# Patient Record
Sex: Female | Born: 1965 | Race: Black or African American | Hispanic: No | Marital: Single
Health system: Southern US, Academic
[De-identification: ages and names within clinical notes are randomized; demographics above are authoritative.]

## PROBLEM LIST (undated history)

## (undated) ENCOUNTER — Ambulatory Visit

## (undated) ENCOUNTER — Encounter
Attending: Student in an Organized Health Care Education/Training Program | Primary: Student in an Organized Health Care Education/Training Program

## (undated) ENCOUNTER — Encounter

## (undated) ENCOUNTER — Encounter: Attending: Internal Medicine | Primary: Internal Medicine

## (undated) ENCOUNTER — Ambulatory Visit: Payer: PRIVATE HEALTH INSURANCE

## (undated) ENCOUNTER — Telehealth

## (undated) ENCOUNTER — Telehealth: Attending: Clinical | Primary: Clinical

## (undated) ENCOUNTER — Encounter
Attending: Pharmacist Clinician (PhC)/ Clinical Pharmacy Specialist | Primary: Pharmacist Clinician (PhC)/ Clinical Pharmacy Specialist

## (undated) ENCOUNTER — Inpatient Hospital Stay: Payer: PRIVATE HEALTH INSURANCE

## (undated) ENCOUNTER — Encounter: Attending: Mental Health | Primary: Mental Health

## (undated) ENCOUNTER — Encounter: Attending: Family | Primary: Family

## (undated) ENCOUNTER — Ambulatory Visit: Payer: MEDICAID

## (undated) ENCOUNTER — Ambulatory Visit
Payer: PRIVATE HEALTH INSURANCE | Attending: Student in an Organized Health Care Education/Training Program | Primary: Student in an Organized Health Care Education/Training Program

## (undated) ENCOUNTER — Telehealth: Attending: Internal Medicine | Primary: Internal Medicine

## (undated) ENCOUNTER — Ambulatory Visit: Attending: Internal Medicine | Primary: Internal Medicine

## (undated) ENCOUNTER — Ambulatory Visit: Payer: PRIVATE HEALTH INSURANCE | Attending: Registered" | Primary: Registered"

## (undated) ENCOUNTER — Ambulatory Visit: Attending: Dermatology | Primary: Dermatology

## (undated) ENCOUNTER — Telehealth: Attending: Family | Primary: Family

## (undated) ENCOUNTER — Ambulatory Visit: Payer: Medicaid (Managed Care) | Attending: Family | Primary: Family

## (undated) ENCOUNTER — Encounter: Attending: Obstetrics & Gynecology | Primary: Obstetrics & Gynecology

## (undated) ENCOUNTER — Ambulatory Visit: Payer: MEDICAID | Attending: Neurology | Primary: Neurology

## (undated) ENCOUNTER — Ambulatory Visit: Payer: PRIVATE HEALTH INSURANCE | Attending: "Endocrinology | Primary: "Endocrinology

## (undated) ENCOUNTER — Encounter: Attending: Physician Assistant | Primary: Physician Assistant

## (undated) ENCOUNTER — Ambulatory Visit: Payer: PRIVATE HEALTH INSURANCE | Attending: Family | Primary: Family

## (undated) ENCOUNTER — Telehealth: Attending: Registered" | Primary: Registered"

## (undated) ENCOUNTER — Ambulatory Visit: Payer: Medicaid (Managed Care)

## (undated) ENCOUNTER — Encounter: Attending: Registered" | Primary: Registered"

## (undated) ENCOUNTER — Ambulatory Visit: Attending: Family | Primary: Family

## (undated) ENCOUNTER — Ambulatory Visit: Payer: Medicare (Managed Care)

## (undated) ENCOUNTER — Telehealth: Attending: Social Worker | Primary: Social Worker

## (undated) ENCOUNTER — Encounter: Attending: "Endocrinology | Primary: "Endocrinology

## (undated) ENCOUNTER — Encounter: Attending: Surgery | Primary: Surgery

## (undated) ENCOUNTER — Other Ambulatory Visit: Attending: Clinical | Primary: Clinical

## (undated) ENCOUNTER — Ambulatory Visit
Attending: Pharmacist Clinician (PhC)/ Clinical Pharmacy Specialist | Primary: Pharmacist Clinician (PhC)/ Clinical Pharmacy Specialist

## (undated) ENCOUNTER — Ambulatory Visit: Attending: Mental Health | Primary: Mental Health

## (undated) ENCOUNTER — Ambulatory Visit: Payer: Medicaid (Managed Care) | Attending: "Endocrinology | Primary: "Endocrinology

## (undated) ENCOUNTER — Ambulatory Visit
Attending: Rehabilitative and Restorative Service Providers" | Primary: Rehabilitative and Restorative Service Providers"

## (undated) DIAGNOSIS — I10 Essential (primary) hypertension: Secondary | ICD-10-CM

## (undated) HISTORY — PX: DILATION AND CURETTAGE OF UTERUS: SHX78

## (undated) HISTORY — DX: Essential (primary) hypertension: I10

## (undated) HISTORY — PX: EYE SURGERY: SHX253

---
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
## 1999-02-25 ENCOUNTER — Emergency Department (HOSPITAL_COMMUNITY): Admission: EM | Admit: 1999-02-25 | Discharge: 1999-02-25 | Payer: Self-pay | Admitting: Emergency Medicine

## 1999-02-25 ENCOUNTER — Encounter: Payer: Self-pay | Admitting: Emergency Medicine

## 2017-06-07 ENCOUNTER — Ambulatory Visit
Admission: RE | Admit: 2017-06-07 | Discharge: 2017-06-07 | Disposition: A | Discharge status: Home / Self Care | Attending: Internal Medicine | Admitting: Internal Medicine

## 2017-06-07 DIAGNOSIS — F5101 Primary insomnia: Secondary | ICD-10-CM

## 2017-06-07 DIAGNOSIS — Z113 Encounter for screening for infections with a predominantly sexual mode of transmission: Secondary | ICD-10-CM

## 2017-06-07 DIAGNOSIS — B373 Candidiasis of vulva and vagina: Secondary | ICD-10-CM

## 2017-06-07 DIAGNOSIS — Z720 Tobacco use: Secondary | ICD-10-CM

## 2017-06-07 DIAGNOSIS — E782 Mixed hyperlipidemia: Secondary | ICD-10-CM

## 2017-06-07 DIAGNOSIS — B9689 Other specified bacterial agents as the cause of diseases classified elsewhere: Secondary | ICD-10-CM

## 2017-06-07 DIAGNOSIS — E039 Hypothyroidism, unspecified: Principal | ICD-10-CM

## 2017-06-07 DIAGNOSIS — B20 Human immunodeficiency virus [HIV] disease: Secondary | ICD-10-CM

## 2017-06-07 DIAGNOSIS — F1990 Other psychoactive substance use, unspecified, uncomplicated: Secondary | ICD-10-CM

## 2017-06-07 DIAGNOSIS — N760 Acute vaginitis: Secondary | ICD-10-CM

## 2017-06-07 DIAGNOSIS — M1990 Unspecified osteoarthritis, unspecified site: Secondary | ICD-10-CM

## 2017-06-07 DIAGNOSIS — M8580 Other specified disorders of bone density and structure, unspecified site: Secondary | ICD-10-CM

## 2017-06-07 MED ORDER — DICLOFENAC 1 % TOPICAL GEL
Freq: Four times a day (QID) | TOPICAL | 0 refills | 0.00000 days | Status: CP
Start: 2017-06-07 — End: 2017-06-08

## 2017-06-07 MED ORDER — CHOLECALCIFEROL (VITAMIN D3) 25 MCG (1,000 UNIT) TABLET
ORAL_TABLET | Freq: Every day | ORAL | 11 refills | 0.00000 days | Status: CP
Start: 2017-06-07 — End: 2018-06-07

## 2017-06-07 MED ORDER — CALCIUM CARBONATE 500 MG CALCIUM (1,250 MG) TABLET
ORAL_TABLET | Freq: Every day | ORAL | 11 refills | 0 days | Status: CP
Start: 2017-06-07 — End: 2018-06-07

## 2017-06-07 MED ORDER — TRAZODONE 50 MG TABLET
ORAL_TABLET | Freq: Every evening | ORAL | 1 refills | 0 days | Status: CP | PRN
Start: 2017-06-07 — End: 2017-09-06

## 2017-06-07 MED ORDER — NICOTINE 21 MG/24 HR DAILY TRANSDERMAL PATCH
MEDICATED_PATCH | TRANSDERMAL | 0 refills | 0 days | Status: CP
Start: 2017-06-07 — End: 2019-04-04

## 2017-06-08 MED ORDER — DICLOFENAC 1 % TOPICAL GEL
Freq: Four times a day (QID) | TOPICAL | 0 refills | 0.00000 days | Status: CP
Start: 2017-06-08 — End: 2018-06-08

## 2017-06-08 MED ORDER — METRONIDAZOLE 500 MG TABLET
ORAL_TABLET | Freq: Two times a day (BID) | ORAL | 0 refills | 0.00000 days | Status: CP
Start: 2017-06-08 — End: 2017-06-15

## 2017-08-09 MED ORDER — GENVOYA 150 MG-150 MG-200 MG-10 MG TABLET
ORAL_TABLET | Freq: Every day | ORAL | 2 refills | 0 days | Status: CP
Start: 2017-08-09 — End: 2018-04-06

## 2017-09-06 ENCOUNTER — Ambulatory Visit: Admission: RE | Admit: 2017-09-06 | Discharge: 2017-09-06 | Attending: Internal Medicine | Admitting: Internal Medicine

## 2017-09-06 DIAGNOSIS — F17200 Nicotine dependence, unspecified, uncomplicated: Secondary | ICD-10-CM

## 2017-09-06 DIAGNOSIS — B20 Human immunodeficiency virus [HIV] disease: Secondary | ICD-10-CM

## 2017-09-06 DIAGNOSIS — F1410 Cocaine abuse, uncomplicated: Secondary | ICD-10-CM

## 2017-09-06 DIAGNOSIS — M5431 Sciatica, right side: Secondary | ICD-10-CM

## 2017-09-06 DIAGNOSIS — Z1231 Encounter for screening mammogram for malignant neoplasm of breast: Secondary | ICD-10-CM

## 2017-09-06 DIAGNOSIS — K210 Gastro-esophageal reflux disease with esophagitis: Secondary | ICD-10-CM

## 2017-09-06 DIAGNOSIS — R21 Rash and other nonspecific skin eruption: Secondary | ICD-10-CM

## 2017-09-06 DIAGNOSIS — F5101 Primary insomnia: Principal | ICD-10-CM

## 2017-09-06 MED ORDER — OMEPRAZOLE 40 MG CAPSULE,DELAYED RELEASE
ORAL_CAPSULE | Freq: Every day | ORAL | 5 refills | 0 days | Status: CP
Start: 2017-09-06 — End: 2017-09-15

## 2017-09-06 MED ORDER — TRAZODONE 50 MG TABLET
ORAL_TABLET | Freq: Every evening | ORAL | 1 refills | 0 days | Status: SS | PRN
Start: 2017-09-06 — End: 2018-01-24

## 2017-09-07 ENCOUNTER — Ambulatory Visit: Admission: RE | Admit: 2017-09-07 | Discharge: 2017-09-07 | Disposition: A | Discharge status: Home / Self Care

## 2017-09-07 DIAGNOSIS — M5431 Sciatica, right side: Principal | ICD-10-CM

## 2017-09-15 MED ORDER — OMEPRAZOLE 20 MG CAPSULE,DELAYED RELEASE
ORAL_CAPSULE | 0 refills | 0 days | Status: CP
Start: 2017-09-15 — End: 2018-03-21

## 2017-11-04 ENCOUNTER — Emergency Department
Admission: EM | Admit: 2017-11-04 | Discharge: 2017-11-04 | Disposition: A | Discharge status: Home / Self Care | Source: Intra-hospital

## 2017-11-04 DIAGNOSIS — R002 Palpitations: Principal | ICD-10-CM

## 2017-11-04 MED ORDER — POTASSIUM CHLORIDE ER 20 MEQ TABLET,EXTENDED RELEASE(PART/CRYST): 20 meq | tablet | Freq: Two times a day (BID) | 0 refills | 0 days | Status: AC

## 2017-11-04 MED ORDER — POTASSIUM CHLORIDE ER 20 MEQ TABLET,EXTENDED RELEASE(PART/CRYST)
ORAL_TABLET | Freq: Two times a day (BID) | ORAL | 0 refills | 0 days | Status: CP
Start: 2017-11-04 — End: 2017-11-04

## 2017-12-13 ENCOUNTER — Ambulatory Visit: Admit: 2017-12-13 | Discharge: 2017-12-13

## 2017-12-13 ENCOUNTER — Ambulatory Visit: Admit: 2017-12-13 | Discharge: 2017-12-13 | Attending: Internal Medicine | Primary: Internal Medicine

## 2017-12-13 DIAGNOSIS — Z1231 Encounter for screening mammogram for malignant neoplasm of breast: Secondary | ICD-10-CM

## 2017-12-13 DIAGNOSIS — B20 Human immunodeficiency virus [HIV] disease: Principal | ICD-10-CM

## 2017-12-13 DIAGNOSIS — F17200 Nicotine dependence, unspecified, uncomplicated: Secondary | ICD-10-CM

## 2017-12-13 DIAGNOSIS — R002 Palpitations: Principal | ICD-10-CM

## 2017-12-13 DIAGNOSIS — Z23 Encounter for immunization: Secondary | ICD-10-CM

## 2017-12-13 DIAGNOSIS — I10 Essential (primary) hypertension: Secondary | ICD-10-CM

## 2017-12-16 ENCOUNTER — Ambulatory Visit: Admit: 2017-12-16 | Discharge: 2017-12-17

## 2017-12-16 DIAGNOSIS — R002 Palpitations: Principal | ICD-10-CM

## 2017-12-16 DIAGNOSIS — R0602 Shortness of breath: Secondary | ICD-10-CM

## 2017-12-16 DIAGNOSIS — I208 Other forms of angina pectoris: Secondary | ICD-10-CM

## 2017-12-26 ENCOUNTER — Institutional Professional Consult (permissible substitution): Admit: 2017-12-26 | Discharge: 2017-12-26

## 2017-12-26 ENCOUNTER — Ambulatory Visit: Admit: 2017-12-26 | Discharge: 2017-12-26

## 2017-12-26 DIAGNOSIS — I208 Other forms of angina pectoris: Secondary | ICD-10-CM

## 2017-12-26 DIAGNOSIS — R002 Palpitations: Principal | ICD-10-CM

## 2018-01-23 ENCOUNTER — Ambulatory Visit: Admit: 2018-01-23 | Discharge: 2018-01-24

## 2018-01-23 DIAGNOSIS — R079 Chest pain, unspecified: Principal | ICD-10-CM

## 2018-01-24 DIAGNOSIS — R079 Chest pain, unspecified: Principal | ICD-10-CM

## 2018-01-24 MED ORDER — TRAZODONE 50 MG TABLET
ORAL_TABLET | Freq: Every evening | ORAL | 1 refills | 0.00000 days | PRN
Start: 2018-01-24 — End: 2018-02-03

## 2018-01-24 MED ORDER — AMLODIPINE 5 MG TABLET
ORAL_TABLET | Freq: Every day | ORAL | 11 refills | 0.00000 days
Start: 2018-01-24 — End: 2018-03-21

## 2018-01-24 MED ORDER — PRAVASTATIN 20 MG TABLET
ORAL_TABLET | Freq: Every day | ORAL | 11 refills | 0 days
Start: 2018-01-24 — End: 2018-03-21

## 2018-01-25 ENCOUNTER — Ambulatory Visit: Admit: 2018-01-25 | Discharge: 2018-01-25

## 2018-01-25 ENCOUNTER — Ambulatory Visit: Admit: 2018-01-25 | Discharge: 2018-02-06

## 2018-02-03 MED ORDER — MELOXICAM 15 MG TABLET
ORAL_TABLET | Freq: Every day | ORAL | 1 refills | 0 days | Status: CP
Start: 2018-02-03 — End: 2018-08-09

## 2018-02-03 MED ORDER — HYDROXYZINE PAMOATE 25 MG CAPSULE
ORAL_CAPSULE | Freq: Two times a day (BID) | ORAL | 1 refills | 0 days | Status: CP | PRN
Start: 2018-02-03 — End: 2018-08-09

## 2018-02-03 MED ORDER — TRAZODONE 50 MG TABLET
ORAL_TABLET | Freq: Every evening | ORAL | 1 refills | 0.00000 days | Status: CP | PRN
Start: 2018-02-03 — End: 2018-04-05

## 2018-02-14 MED ORDER — AMLODIPINE 5 MG TABLET
ORAL_TABLET | 0 refills | 0 days | Status: CP
Start: 2018-02-14 — End: 2018-03-21

## 2018-02-14 MED ORDER — PRAVASTATIN 20 MG TABLET
ORAL_TABLET | 0 refills | 0 days | Status: CP
Start: 2018-02-14 — End: 2018-03-21

## 2018-02-14 MED ORDER — GABAPENTIN 600 MG TABLET
ORAL_TABLET | 0 refills | 0 days | Status: CP
Start: 2018-02-14 — End: 2018-03-14

## 2018-03-14 MED ORDER — GABAPENTIN 600 MG TABLET
ORAL_TABLET | Freq: Every evening | ORAL | 1 refills | 0 days | Status: CP
Start: 2018-03-14 — End: 2018-06-07

## 2018-03-15 ENCOUNTER — Institutional Professional Consult (permissible substitution): Admit: 2018-03-15 | Discharge: 2018-03-16

## 2018-03-15 DIAGNOSIS — B20 Human immunodeficiency virus [HIV] disease: Secondary | ICD-10-CM

## 2018-03-15 DIAGNOSIS — E039 Hypothyroidism, unspecified: Principal | ICD-10-CM

## 2018-03-21 MED ORDER — AMLODIPINE 5 MG TABLET
ORAL_TABLET | Freq: Every day | ORAL | 2 refills | 0 days
Start: 2018-03-21 — End: 2018-06-29

## 2018-03-21 MED ORDER — PRAVASTATIN 20 MG TABLET
ORAL_TABLET | Freq: Every day | ORAL | 2 refills | 0.00000 days
Start: 2018-03-21 — End: 2018-03-31

## 2018-03-21 MED ORDER — OMEPRAZOLE 20 MG CAPSULE,DELAYED RELEASE
ORAL_CAPSULE | Freq: Every day | ORAL | 0 refills | 0.00000 days | Status: CP
Start: 2018-03-21 — End: 2018-05-31

## 2018-04-07 MED ORDER — PRAVASTATIN 20 MG TABLET
ORAL_TABLET | Freq: Every day | ORAL | 2 refills | 0 days
Start: 2018-04-07 — End: 2018-06-29

## 2018-04-07 MED ORDER — TRAZODONE 50 MG TABLET
ORAL_TABLET | Freq: Every evening | ORAL | 1 refills | 0 days | Status: CP | PRN
Start: 2018-04-07 — End: 2018-08-09

## 2018-04-07 MED ORDER — ELVITEG 150 MG-COB 150 MG-EMTRICIT 200 MG-TENOFO ALAFENAM 10 MG TABLET
ORAL_TABLET | Freq: Every day | ORAL | 2 refills | 0 days | Status: CP
Start: 2018-04-07 — End: 2018-12-13

## 2018-05-31 MED ORDER — OMEPRAZOLE 20 MG CAPSULE,DELAYED RELEASE
ORAL_CAPSULE | Freq: Every day | ORAL | 2 refills | 0.00000 days | Status: CP
Start: 2018-05-31 — End: 2018-08-09

## 2018-06-07 MED ORDER — GABAPENTIN 600 MG TABLET
ORAL_TABLET | Freq: Every evening | ORAL | 1 refills | 0.00000 days | Status: CP
Start: 2018-06-07 — End: 2018-08-01

## 2018-06-08 ENCOUNTER — Ambulatory Visit
Admit: 2018-06-08 | Discharge: 2018-06-08 | Disposition: A | Discharge status: Home / Self Care | Payer: MEDICAID | Attending: Emergency Medicine

## 2018-06-08 ENCOUNTER — Emergency Department
Admit: 2018-06-08 | Discharge: 2018-06-08 | Disposition: A | Discharge status: Home / Self Care | Payer: MEDICAID | Attending: Emergency Medicine

## 2018-06-08 DIAGNOSIS — L989 Disorder of the skin and subcutaneous tissue, unspecified: Principal | ICD-10-CM

## 2018-06-08 MED ORDER — DOXYCYCLINE HYCLATE 100 MG CAPSULE
ORAL_CAPSULE | Freq: Two times a day (BID) | ORAL | 0 refills | 0.00000 days | Status: CP
Start: 2018-06-08 — End: 2018-06-18

## 2018-06-09 MED ORDER — FLUCONAZOLE 150 MG TABLET
ORAL_TABLET | Freq: Once | ORAL | 1 refills | 0 days | Status: CP
Start: 2018-06-09 — End: 2018-06-09

## 2018-06-30 MED ORDER — AMLODIPINE 5 MG TABLET
ORAL_TABLET | Freq: Every day | ORAL | 2 refills | 0 days
Start: 2018-06-30 — End: 2018-07-11

## 2018-06-30 MED ORDER — PRAVASTATIN 20 MG TABLET
ORAL_TABLET | Freq: Every day | ORAL | 2 refills | 0.00000 days
Start: 2018-06-30 — End: 2018-07-11

## 2018-07-11 MED ORDER — AMLODIPINE 5 MG TABLET
ORAL_TABLET | Freq: Every day | ORAL | 2 refills | 0 days
Start: 2018-07-11 — End: 2018-08-09

## 2018-07-11 MED ORDER — PRAVASTATIN 20 MG TABLET
ORAL_TABLET | Freq: Every day | ORAL | 2 refills | 0 days
Start: 2018-07-11 — End: 2018-08-09

## 2018-08-01 MED ORDER — GABAPENTIN 600 MG TABLET
ORAL_TABLET | Freq: Every evening | ORAL | 1 refills | 0 days | Status: CP
Start: 2018-08-01 — End: 2018-08-09

## 2018-08-09 ENCOUNTER — Ambulatory Visit: Admit: 2018-08-09 | Discharge: 2018-08-10 | Attending: Internal Medicine | Primary: Internal Medicine

## 2018-08-09 DIAGNOSIS — F5101 Primary insomnia: Secondary | ICD-10-CM

## 2018-08-09 DIAGNOSIS — Z23 Encounter for immunization: Secondary | ICD-10-CM

## 2018-08-09 DIAGNOSIS — B20 Human immunodeficiency virus [HIV] disease: Principal | ICD-10-CM

## 2018-08-09 DIAGNOSIS — K219 Gastro-esophageal reflux disease without esophagitis: Secondary | ICD-10-CM

## 2018-08-09 DIAGNOSIS — M5431 Sciatica, right side: Secondary | ICD-10-CM

## 2018-08-09 DIAGNOSIS — E782 Mixed hyperlipidemia: Secondary | ICD-10-CM

## 2018-08-09 DIAGNOSIS — I10 Essential (primary) hypertension: Secondary | ICD-10-CM

## 2018-08-09 DIAGNOSIS — Z1211 Encounter for screening for malignant neoplasm of colon: Secondary | ICD-10-CM

## 2018-08-09 MED ORDER — PRAVASTATIN 20 MG TABLET
ORAL_TABLET | Freq: Every day | ORAL | 2 refills | 0 days
Start: 2018-08-09 — End: 2018-11-16

## 2018-08-09 MED ORDER — GABAPENTIN 600 MG TABLET
ORAL_TABLET | Freq: Every evening | ORAL | 1 refills | 0.00000 days | Status: CP
Start: 2018-08-09 — End: 2018-12-13

## 2018-08-09 MED ORDER — HYDROXYZINE PAMOATE 25 MG CAPSULE
ORAL_CAPSULE | Freq: Every day | ORAL | 0 refills | 0.00000 days | Status: CP | PRN
Start: 2018-08-09 — End: 2018-12-13

## 2018-08-09 MED ORDER — TRAZODONE 50 MG TABLET
ORAL_TABLET | Freq: Every evening | ORAL | 0 refills | 0 days | Status: CP | PRN
Start: 2018-08-09 — End: 2018-09-07

## 2018-08-09 MED ORDER — AMLODIPINE 5 MG TABLET
ORAL_TABLET | Freq: Every day | ORAL | 2 refills | 0 days
Start: 2018-08-09 — End: 2018-08-14

## 2018-08-09 MED ORDER — OMEPRAZOLE 20 MG CAPSULE,DELAYED RELEASE
ORAL_CAPSULE | Freq: Every day | ORAL | 2 refills | 0.00000 days | Status: CP
Start: 2018-08-09 — End: 2018-12-13

## 2018-08-09 MED ORDER — MELOXICAM 7.5 MG TABLET
ORAL_TABLET | Freq: Every day | ORAL | 1 refills | 0 days | Status: CP | PRN
Start: 2018-08-09 — End: 2018-09-08

## 2018-08-14 MED ORDER — AMLODIPINE 5 MG TABLET
ORAL_TABLET | Freq: Every day | ORAL | 2 refills | 0 days
Start: 2018-08-14 — End: 2018-11-16

## 2018-09-08 MED ORDER — TRAZODONE 50 MG TABLET
ORAL_TABLET | Freq: Every evening | ORAL | 0 refills | 0.00000 days | Status: CP | PRN
Start: 2018-09-08 — End: 2018-10-04

## 2018-09-23 ENCOUNTER — Ambulatory Visit: Admit: 2018-09-23 | Discharge: 2018-09-23 | Disposition: A | Discharge status: Home / Self Care | Payer: MEDICAID

## 2018-09-23 MED ORDER — METRONIDAZOLE 500 MG TABLET
ORAL_TABLET | Freq: Two times a day (BID) | ORAL | 0 refills | 0 days | Status: CP
Start: 2018-09-23 — End: 2018-09-30

## 2018-10-04 MED ORDER — TRAZODONE 50 MG TABLET
ORAL_TABLET | Freq: Every evening | ORAL | 0 refills | 0 days | Status: CP | PRN
Start: 2018-10-04 — End: 2018-11-16

## 2018-10-06 MED ORDER — ALBUTEROL SULFATE HFA 90 MCG/ACTUATION AEROSOL INHALER
RESPIRATORY_TRACT | 3 refills | 0 days | Status: CP | PRN
Start: 2018-10-06 — End: 2018-12-13

## 2018-10-06 MED ORDER — B-COMPLEX WITH VITAMIN C TABLET
ORAL_TABLET | Freq: Every day | ORAL | 12 refills | 0.00000 days | Status: CP
Start: 2018-10-06 — End: ?

## 2018-10-25 ENCOUNTER — Ambulatory Visit: Admit: 2018-10-25 | Discharge: 2018-10-25 | Attending: Obstetrics & Gynecology | Primary: Obstetrics & Gynecology

## 2018-10-25 DIAGNOSIS — N879 Dysplasia of cervix uteri, unspecified: Secondary | ICD-10-CM

## 2018-10-25 DIAGNOSIS — R87810 Cervical high risk human papillomavirus (HPV) DNA test positive: Principal | ICD-10-CM

## 2018-11-19 MED ORDER — TRAZODONE 50 MG TABLET
ORAL_TABLET | Freq: Every evening | ORAL | 0 refills | 0 days | Status: CP | PRN
Start: 2018-11-19 — End: 2018-12-13

## 2018-11-19 MED ORDER — AMLODIPINE 5 MG TABLET
ORAL_TABLET | Freq: Every day | ORAL | 0 refills | 0 days
Start: 2018-11-19 — End: 2018-12-13

## 2018-11-19 MED ORDER — PRAVASTATIN 20 MG TABLET
ORAL_TABLET | Freq: Every day | ORAL | 2 refills | 0 days
Start: 2018-11-19 — End: 2018-12-13

## 2018-12-13 ENCOUNTER — Ambulatory Visit: Admit: 2018-12-13 | Discharge: 2018-12-14 | Payer: MEDICAID | Attending: Internal Medicine | Primary: Internal Medicine

## 2018-12-13 DIAGNOSIS — Z21 Asymptomatic human immunodeficiency virus [HIV] infection status: Principal | ICD-10-CM

## 2018-12-13 DIAGNOSIS — R21 Rash and other nonspecific skin eruption: Secondary | ICD-10-CM

## 2018-12-13 DIAGNOSIS — M150 Primary generalized (osteo)arthritis: Secondary | ICD-10-CM

## 2018-12-13 DIAGNOSIS — Z1231 Encounter for screening mammogram for malignant neoplasm of breast: Secondary | ICD-10-CM

## 2018-12-13 MED ORDER — AMLODIPINE 5 MG TABLET
ORAL_TABLET | Freq: Every day | ORAL | 0 refills | 0 days
Start: 2018-12-13 — End: 2018-12-19

## 2018-12-13 MED ORDER — HYDROXYZINE PAMOATE 25 MG CAPSULE
ORAL_CAPSULE | Freq: Every day | ORAL | 0 refills | 0 days | Status: CP | PRN
Start: 2018-12-13 — End: ?

## 2018-12-13 MED ORDER — MELOXICAM 15 MG TABLET
ORAL_TABLET | Freq: Every day | ORAL | 5 refills | 0.00000 days | Status: CP
Start: 2018-12-13 — End: 2019-12-13

## 2018-12-13 MED ORDER — ALBUTEROL SULFATE HFA 90 MCG/ACTUATION AEROSOL INHALER
RESPIRATORY_TRACT | 3 refills | 0.00000 days | Status: CP | PRN
Start: 2018-12-13 — End: 2019-12-13

## 2018-12-13 MED ORDER — MELOXICAM 15 MG TABLET: 15 mg | tablet | Freq: Every day | 5 refills | 0 days | Status: AC

## 2018-12-13 MED ORDER — TRAZODONE 50 MG TABLET
ORAL_TABLET | Freq: Every evening | ORAL | 0 refills | 0 days | Status: CP | PRN
Start: 2018-12-13 — End: 2019-01-04

## 2018-12-13 MED ORDER — TRIAMCINOLONE ACETONIDE 0.1 % TOPICAL CREAM
Freq: Two times a day (BID) | TOPICAL | 1 refills | 0 days | Status: CP
Start: 2018-12-13 — End: 2019-12-13

## 2018-12-13 MED ORDER — GABAPENTIN 600 MG TABLET
ORAL_TABLET | Freq: Every evening | ORAL | 1 refills | 0 days | Status: CP
Start: 2018-12-13 — End: 2019-02-05

## 2018-12-13 MED ORDER — PRAVASTATIN 20 MG TABLET
ORAL_TABLET | Freq: Every day | ORAL | 2 refills | 0 days
Start: 2018-12-13 — End: 2019-01-30

## 2018-12-13 MED ORDER — BICTEGRAVIR 50 MG-EMTRICITABINE 200 MG-TENOFOVIR ALAFENAM 25 MG TABLET
ORAL_TABLET | Freq: Every day | ORAL | 5 refills | 0 days | Status: CP
Start: 2018-12-13 — End: 2019-05-25

## 2018-12-13 MED ORDER — OMEPRAZOLE 20 MG CAPSULE,DELAYED RELEASE
ORAL_CAPSULE | Freq: Every day | ORAL | 2 refills | 0 days | Status: CP
Start: 2018-12-13 — End: 2019-02-02

## 2018-12-19 MED ORDER — AMLODIPINE 5 MG TABLET
ORAL_TABLET | Freq: Every day | ORAL | 0 refills | 0 days | Status: CP
Start: 2018-12-19 — End: 2019-01-04

## 2019-01-05 MED ORDER — TRAZODONE 50 MG TABLET
ORAL_TABLET | Freq: Every evening | ORAL | 0 refills | 0 days | Status: CP | PRN
Start: 2019-01-05 — End: 2019-02-02

## 2019-01-05 MED ORDER — AMLODIPINE 5 MG TABLET
ORAL_TABLET | Freq: Every day | ORAL | 0 refills | 0 days | Status: CP
Start: 2019-01-05 — End: 2019-02-02

## 2019-01-19 ENCOUNTER — Ambulatory Visit
Admit: 2019-01-19 | Discharge: 2019-01-19 | Disposition: A | Discharge status: Home / Self Care | Payer: MEDICAID | Attending: Emergency Medicine

## 2019-01-19 MED ORDER — OSELTAMIVIR 75 MG CAPSULE
ORAL_CAPSULE | Freq: Two times a day (BID) | ORAL | 0 refills | 0 days | Status: CP
Start: 2019-01-19 — End: 2019-01-24

## 2019-01-30 DIAGNOSIS — E782 Mixed hyperlipidemia: Principal | ICD-10-CM

## 2019-01-30 MED ORDER — PRAVASTATIN 20 MG TABLET
ORAL_TABLET | Freq: Every day | ORAL | 2 refills | 0 days
Start: 2019-01-30 — End: 2019-03-09

## 2019-02-02 DIAGNOSIS — I10 Essential (primary) hypertension: Principal | ICD-10-CM

## 2019-02-02 DIAGNOSIS — K219 Gastro-esophageal reflux disease without esophagitis: Principal | ICD-10-CM

## 2019-02-02 DIAGNOSIS — F5101 Primary insomnia: Principal | ICD-10-CM

## 2019-02-05 DIAGNOSIS — M5431 Sciatica, right side: Principal | ICD-10-CM

## 2019-02-08 MED ORDER — GABAPENTIN 600 MG TABLET
ORAL_TABLET | Freq: Every evening | ORAL | 1 refills | 0.00000 days | Status: CP
Start: 2019-02-08 — End: 2019-04-10

## 2019-02-08 MED ORDER — AMLODIPINE 5 MG TABLET
ORAL_TABLET | Freq: Every day | ORAL | 0 refills | 0 days | Status: CP
Start: 2019-02-08 — End: 2019-02-27

## 2019-02-08 MED ORDER — OMEPRAZOLE 20 MG CAPSULE,DELAYED RELEASE
ORAL_CAPSULE | Freq: Every day | ORAL | 2 refills | 0 days | Status: CP
Start: 2019-02-08 — End: 2019-05-29

## 2019-02-08 MED ORDER — TRAZODONE 50 MG TABLET
ORAL_TABLET | Freq: Every evening | ORAL | 2 refills | 0 days | Status: CP | PRN
Start: 2019-02-08 — End: 2019-05-01

## 2019-02-16 MED ORDER — FLUCONAZOLE 150 MG TABLET
ORAL_TABLET | Freq: Once | ORAL | 0 refills | 0.00000 days | Status: CP
Start: 2019-02-16 — End: 2019-02-16

## 2019-02-28 MED ORDER — AMLODIPINE 5 MG TABLET
ORAL_TABLET | Freq: Every day | ORAL | 3 refills | 0 days | Status: CP
Start: 2019-02-28 — End: 2019-05-25

## 2019-03-09 MED ORDER — PRAVASTATIN 20 MG TABLET
ORAL_TABLET | Freq: Every day | ORAL | 2 refills | 0 days
Start: 2019-03-09 — End: 2019-04-26

## 2019-04-04 ENCOUNTER — Institutional Professional Consult (permissible substitution): Admit: 2019-04-04 | Discharge: 2019-04-05 | Payer: MEDICAID | Attending: Internal Medicine | Primary: Internal Medicine

## 2019-04-04 DIAGNOSIS — Z1211 Encounter for screening for malignant neoplasm of colon: Secondary | ICD-10-CM

## 2019-04-04 DIAGNOSIS — B20 Human immunodeficiency virus [HIV] disease: Principal | ICD-10-CM

## 2019-04-10 MED ORDER — GABAPENTIN 600 MG TABLET
ORAL_TABLET | Freq: Every evening | ORAL | 1 refills | 0 days | Status: CP
Start: 2019-04-10 — End: 2019-05-23

## 2019-04-17 ENCOUNTER — Encounter: Admit: 2019-04-17 | Discharge: 2019-04-17 | Attending: Certified Registered" | Primary: Certified Registered"

## 2019-04-17 ENCOUNTER — Ambulatory Visit: Admit: 2019-04-17 | Discharge: 2019-04-17

## 2019-04-17 DIAGNOSIS — K4030 Unilateral inguinal hernia, with obstruction, without gangrene, not specified as recurrent: Principal | ICD-10-CM

## 2019-04-18 MED ORDER — HYDROCODONE 5 MG-ACETAMINOPHEN 325 MG TABLET
ORAL_TABLET | Freq: Four times a day (QID) | ORAL | 0 refills | 0 days | Status: CP | PRN
Start: 2019-04-18 — End: ?

## 2019-04-26 MED ORDER — PRAVASTATIN 20 MG TABLET
ORAL_TABLET | Freq: Every day | ORAL | 2 refills | 0 days
Start: 2019-04-26 — End: 2019-05-02

## 2019-05-01 MED ORDER — TRAZODONE 50 MG TABLET
ORAL_TABLET | Freq: Every evening | ORAL | 1 refills | 0.00000 days | Status: CP | PRN
Start: 2019-05-01 — End: 2019-09-02

## 2019-05-02 ENCOUNTER — Ambulatory Visit: Admit: 2019-05-02 | Discharge: 2019-05-02

## 2019-05-02 ENCOUNTER — Ambulatory Visit: Admit: 2019-05-02 | Discharge: 2019-05-02 | Attending: Surgery | Primary: Surgery

## 2019-05-02 DIAGNOSIS — B20 Human immunodeficiency virus [HIV] disease: Principal | ICD-10-CM

## 2019-05-02 DIAGNOSIS — Z9889 Other specified postprocedural states: Principal | ICD-10-CM

## 2019-05-02 DIAGNOSIS — K4030 Unilateral inguinal hernia, with obstruction, without gangrene, not specified as recurrent: Secondary | ICD-10-CM

## 2019-05-02 DIAGNOSIS — Z8719 Personal history of other diseases of the digestive system: Secondary | ICD-10-CM

## 2019-05-02 MED ORDER — PRAVASTATIN 20 MG TABLET
ORAL_TABLET | Freq: Every day | ORAL | 2 refills | 0 days
Start: 2019-05-02 — End: 2019-05-25

## 2019-05-23 MED ORDER — GABAPENTIN 600 MG TABLET
ORAL_TABLET | Freq: Every evening | ORAL | 1 refills | 0.00000 days | Status: CP
Start: 2019-05-23 — End: ?

## 2019-05-25 MED ORDER — AMLODIPINE 5 MG TABLET
ORAL_TABLET | Freq: Every day | ORAL | 0 refills | 0 days | Status: CP
Start: 2019-05-25 — End: 2019-06-24

## 2019-05-25 MED ORDER — PRAVASTATIN 20 MG TABLET
ORAL_TABLET | Freq: Every day | ORAL | 0 refills | 0.00000 days
Start: 2019-05-25 — End: 2019-07-02

## 2019-05-25 MED ORDER — BIKTARVY 50 MG-200 MG-25 MG TABLET
ORAL_TABLET | Freq: Every day | ORAL | 0 refills | 0 days | Status: CP
Start: 2019-05-25 — End: 2019-07-02

## 2019-05-30 MED ORDER — OMEPRAZOLE 20 MG CAPSULE,DELAYED RELEASE
ORAL_CAPSULE | Freq: Every day | ORAL | 2 refills | 0.00000 days | Status: CP
Start: 2019-05-30 — End: ?

## 2019-07-02 MED ORDER — PRAVASTATIN 20 MG TABLET
ORAL_TABLET | Freq: Every day | ORAL | 5 refills | 30.00000 days
Start: 2019-07-02 — End: 2019-08-01

## 2019-07-02 MED ORDER — BIKTARVY 50 MG-200 MG-25 MG TABLET
ORAL_TABLET | Freq: Every day | ORAL | 5 refills | 30 days
Start: 2019-07-02 — End: ?

## 2019-07-31 DIAGNOSIS — I10 Essential (primary) hypertension: Secondary | ICD-10-CM

## 2019-08-01 MED ORDER — AMLODIPINE 5 MG TABLET
ORAL_TABLET | Freq: Every day | ORAL | 5 refills | 30.00000 days | Status: CP
Start: 2019-08-01 — End: 2019-08-31

## 2019-08-08 ENCOUNTER — Ambulatory Visit: Admit: 2019-08-08 | Discharge: 2019-08-09 | Attending: Internal Medicine | Primary: Internal Medicine

## 2019-08-08 DIAGNOSIS — E038 Other specified hypothyroidism: Secondary | ICD-10-CM

## 2019-08-08 DIAGNOSIS — B20 Human immunodeficiency virus [HIV] disease: Secondary | ICD-10-CM

## 2019-08-08 DIAGNOSIS — Z1211 Encounter for screening for malignant neoplasm of colon: Secondary | ICD-10-CM

## 2019-08-08 DIAGNOSIS — Z124 Encounter for screening for malignant neoplasm of cervix: Secondary | ICD-10-CM

## 2019-08-08 DIAGNOSIS — N879 Dysplasia of cervix uteri, unspecified: Secondary | ICD-10-CM

## 2019-08-08 DIAGNOSIS — Z1231 Encounter for screening mammogram for malignant neoplasm of breast: Secondary | ICD-10-CM

## 2019-08-14 ENCOUNTER — Ambulatory Visit: Admit: 2019-08-14 | Discharge: 2019-08-15

## 2019-08-14 DIAGNOSIS — E038 Other specified hypothyroidism: Secondary | ICD-10-CM

## 2019-08-15 DIAGNOSIS — E0590 Thyrotoxicosis, unspecified without thyrotoxic crisis or storm: Secondary | ICD-10-CM

## 2019-08-16 ENCOUNTER — Emergency Department: Admit: 2019-08-16 | Discharge: 2019-08-16 | Disposition: A | Discharge status: Home / Self Care | Payer: MEDICAID

## 2019-08-16 ENCOUNTER — Ambulatory Visit: Admit: 2019-08-16 | Discharge: 2019-08-16 | Disposition: A | Discharge status: Home / Self Care | Payer: MEDICAID

## 2019-08-16 DIAGNOSIS — Z21 Asymptomatic human immunodeficiency virus [HIV] infection status: Secondary | ICD-10-CM

## 2019-08-16 DIAGNOSIS — Z87891 Personal history of nicotine dependence: Secondary | ICD-10-CM

## 2019-08-16 DIAGNOSIS — Z79899 Other long term (current) drug therapy: Secondary | ICD-10-CM

## 2019-08-16 DIAGNOSIS — J029 Acute pharyngitis, unspecified: Secondary | ICD-10-CM

## 2019-08-16 DIAGNOSIS — E0590 Thyrotoxicosis, unspecified without thyrotoxic crisis or storm: Secondary | ICD-10-CM

## 2019-08-16 DIAGNOSIS — J45909 Unspecified asthma, uncomplicated: Secondary | ICD-10-CM

## 2019-08-16 DIAGNOSIS — Z885 Allergy status to narcotic agent status: Secondary | ICD-10-CM

## 2019-08-16 DIAGNOSIS — R5383 Other fatigue: Secondary | ICD-10-CM

## 2019-08-16 DIAGNOSIS — E785 Hyperlipidemia, unspecified: Secondary | ICD-10-CM

## 2019-08-16 DIAGNOSIS — I10 Essential (primary) hypertension: Secondary | ICD-10-CM

## 2019-08-16 DIAGNOSIS — F329 Major depressive disorder, single episode, unspecified: Secondary | ICD-10-CM

## 2019-08-24 DIAGNOSIS — F5101 Primary insomnia: Secondary | ICD-10-CM

## 2019-08-24 DIAGNOSIS — K219 Gastro-esophageal reflux disease without esophagitis: Secondary | ICD-10-CM

## 2019-08-24 MED ORDER — OMEPRAZOLE 20 MG CAPSULE,DELAYED RELEASE
ORAL_CAPSULE | Freq: Every day | ORAL | 2 refills | 30.00000 days | Status: CP
Start: 2019-08-24 — End: ?

## 2019-08-24 MED ORDER — TRAZODONE 50 MG TABLET
ORAL_TABLET | Freq: Every evening | ORAL | 1 refills | 30 days | Status: CP | PRN
Start: 2019-08-24 — End: 2019-12-26

## 2019-09-01 ENCOUNTER — Ambulatory Visit: Admit: 2019-09-01 | Discharge: 2019-09-02 | Disposition: A | Discharge status: Home / Self Care | Payer: MEDICAID

## 2019-09-01 ENCOUNTER — Emergency Department: Admit: 2019-09-01 | Discharge: 2019-09-02 | Disposition: A | Discharge status: Home / Self Care | Payer: MEDICAID

## 2019-09-01 DIAGNOSIS — Z791 Long term (current) use of non-steroidal anti-inflammatories (NSAID): Secondary | ICD-10-CM

## 2019-09-01 DIAGNOSIS — F17210 Nicotine dependence, cigarettes, uncomplicated: Secondary | ICD-10-CM

## 2019-09-01 DIAGNOSIS — E0590 Thyrotoxicosis, unspecified without thyrotoxic crisis or storm: Secondary | ICD-10-CM

## 2019-09-01 DIAGNOSIS — Z79899 Other long term (current) drug therapy: Secondary | ICD-10-CM

## 2019-09-01 DIAGNOSIS — I10 Essential (primary) hypertension: Secondary | ICD-10-CM

## 2019-09-01 DIAGNOSIS — Z21 Asymptomatic human immunodeficiency virus [HIV] infection status: Secondary | ICD-10-CM

## 2019-09-03 DIAGNOSIS — K219 Gastro-esophageal reflux disease without esophagitis: Principal | ICD-10-CM

## 2019-09-07 ENCOUNTER — Ambulatory Visit: Admit: 2019-09-07 | Discharge: 2019-09-08 | Attending: Internal Medicine | Primary: Internal Medicine

## 2019-09-07 DIAGNOSIS — E0590 Thyrotoxicosis, unspecified without thyrotoxic crisis or storm: Principal | ICD-10-CM

## 2019-09-07 MED ORDER — METHIMAZOLE 10 MG TABLET: 10 mg | tablet | Freq: Every day | 3 refills | 90 days | Status: AC

## 2019-09-18 ENCOUNTER — Ambulatory Visit: Admit: 2019-09-18 | Discharge: 2019-09-19

## 2019-09-19 DIAGNOSIS — M5431 Sciatica, right side: Principal | ICD-10-CM

## 2019-09-26 DIAGNOSIS — M5431 Sciatica, right side: Principal | ICD-10-CM

## 2019-10-02 DIAGNOSIS — M5431 Sciatica, right side: Principal | ICD-10-CM

## 2019-10-02 MED ORDER — GABAPENTIN 600 MG TABLET
ORAL_TABLET | Freq: Every evening | ORAL | 1 refills | 30 days | Status: CP
Start: 2019-10-02 — End: ?

## 2019-10-10 ENCOUNTER — Institutional Professional Consult (permissible substitution): Admit: 2019-10-10 | Discharge: 2019-10-11 | Attending: Internal Medicine | Primary: Internal Medicine

## 2019-10-11 ENCOUNTER — Ambulatory Visit: Admit: 2019-10-11 | Discharge: 2019-10-12

## 2019-10-11 DIAGNOSIS — E0590 Thyrotoxicosis, unspecified without thyrotoxic crisis or storm: Principal | ICD-10-CM

## 2019-10-12 DIAGNOSIS — E0590 Thyrotoxicosis, unspecified without thyrotoxic crisis or storm: Principal | ICD-10-CM

## 2019-10-16 DIAGNOSIS — F5101 Primary insomnia: Principal | ICD-10-CM

## 2019-10-17 ENCOUNTER — Ambulatory Visit: Admit: 2019-10-17 | Discharge: 2019-10-18

## 2019-10-17 MED ORDER — TRAZODONE 50 MG TABLET
ORAL_TABLET | 1 refills | 0 days | Status: CP
Start: 2019-10-17 — End: ?

## 2019-11-09 DIAGNOSIS — E0590 Thyrotoxicosis, unspecified without thyrotoxic crisis or storm: Principal | ICD-10-CM

## 2019-11-12 ENCOUNTER — Ambulatory Visit: Admit: 2019-11-12 | Discharge: 2019-11-13 | Attending: Internal Medicine | Primary: Internal Medicine

## 2019-11-12 DIAGNOSIS — E0590 Thyrotoxicosis, unspecified without thyrotoxic crisis or storm: Principal | ICD-10-CM

## 2019-11-12 MED ORDER — PREDNISONE 5 MG TABLET,DELAYED RELEASE
ORAL_TABLET | Freq: Every day | ORAL | 0 refills | 0.00000 days | Status: CP
Start: 2019-11-12 — End: 2019-11-12

## 2019-11-12 MED ORDER — METHIMAZOLE 10 MG TABLET
ORAL_TABLET | Freq: Every day | ORAL | 3 refills | 60.00000 days | Status: CP
Start: 2019-11-12 — End: 2020-11-11

## 2019-11-12 MED ORDER — PREDNISONE 5 MG TABLET
ORAL_TABLET | Freq: Every day | ORAL | 0 refills | 7 days | Status: CP
Start: 2019-11-12 — End: ?

## 2019-11-13 MED ORDER — FLUCONAZOLE 150 MG TABLET
ORAL_TABLET | 0 refills | 0 days | Status: CP
Start: 2019-11-13 — End: ?

## 2019-11-19 DIAGNOSIS — K219 Gastro-esophageal reflux disease without esophagitis: Principal | ICD-10-CM

## 2019-11-19 DIAGNOSIS — E039 Hypothyroidism, unspecified: Principal | ICD-10-CM

## 2019-11-19 DIAGNOSIS — M5431 Sciatica, right side: Principal | ICD-10-CM

## 2019-11-19 MED ORDER — B-COMPLEX WITH VITAMIN C TABLET
ORAL_TABLET | Freq: Every day | ORAL | 12 refills | 30 days | Status: CP
Start: 2019-11-19 — End: ?

## 2019-11-20 DIAGNOSIS — M5431 Sciatica, right side: Principal | ICD-10-CM

## 2019-11-20 MED ORDER — GABAPENTIN 600 MG TABLET
ORAL_TABLET | Freq: Every evening | ORAL | 1 refills | 30 days | Status: CP
Start: 2019-11-20 — End: ?

## 2019-11-27 DIAGNOSIS — K219 Gastro-esophageal reflux disease without esophagitis: Principal | ICD-10-CM

## 2019-11-27 MED ORDER — OMEPRAZOLE 20 MG CAPSULE,DELAYED RELEASE
ORAL_CAPSULE | Freq: Every day | ORAL | 2 refills | 30.00000 days | Status: CP
Start: 2019-11-27 — End: ?

## 2019-12-14 DIAGNOSIS — E782 Mixed hyperlipidemia: Principal | ICD-10-CM

## 2019-12-14 MED ORDER — PRAVASTATIN 20 MG TABLET
ORAL_TABLET | Freq: Every day | ORAL | 5 refills | 30 days
Start: 2019-12-14 — End: 2020-01-13

## 2019-12-16 ENCOUNTER — Ambulatory Visit
Admit: 2019-12-16 | Discharge: 2019-12-16 | Disposition: A | Discharge status: Home / Self Care | Payer: MEDICAID | Attending: Emergency Medicine

## 2019-12-16 DIAGNOSIS — M791 Myalgia, unspecified site: Principal | ICD-10-CM

## 2019-12-16 MED ORDER — CYCLOBENZAPRINE 10 MG TABLET
ORAL_TABLET | Freq: Two times a day (BID) | ORAL | 0 refills | 10.00000 days | Status: CP | PRN
Start: 2019-12-16 — End: 2019-12-16

## 2019-12-16 MED ORDER — CYCLOBENZAPRINE 10 MG TABLET: 10 mg | tablet | Freq: Two times a day (BID) | 0 refills | 10 days | Status: AC

## 2019-12-21 DIAGNOSIS — Z21 Asymptomatic human immunodeficiency virus [HIV] infection status: Principal | ICD-10-CM

## 2019-12-21 MED ORDER — BIKTARVY 50 MG-200 MG-25 MG TABLET
ORAL_TABLET | Freq: Every day | ORAL | 5 refills | 30.00000 days
Start: 2019-12-21 — End: ?

## 2019-12-24 ENCOUNTER — Ambulatory Visit: Admit: 2019-12-24 | Discharge: 2019-12-25 | Disposition: A | Discharge status: Home / Self Care | Payer: MEDICAID

## 2019-12-24 DIAGNOSIS — F5101 Primary insomnia: Principal | ICD-10-CM

## 2019-12-24 DIAGNOSIS — S39012A Strain of muscle, fascia and tendon of lower back, initial encounter: Principal | ICD-10-CM

## 2019-12-24 MED ORDER — METHOCARBAMOL 500 MG TABLET
ORAL_TABLET | Freq: Three times a day (TID) | ORAL | 0 refills | 7.00000 days | Status: CP | PRN
Start: 2019-12-24 — End: 2020-01-03

## 2019-12-25 MED ORDER — TRAZODONE 50 MG TABLET
ORAL_TABLET | Freq: Every evening | ORAL | 1 refills | 30 days | Status: CP
Start: 2019-12-25 — End: ?

## 2019-12-31 ENCOUNTER — Ambulatory Visit: Admit: 2019-12-31 | Discharge: 2019-12-31 | Disposition: A | Discharge status: Home / Self Care | Payer: MEDICAID

## 2019-12-31 MED ORDER — OXYCODONE 5 MG TABLET
ORAL_TABLET | Freq: Four times a day (QID) | ORAL | 0 refills | 3.00000 days | Status: CP | PRN
Start: 2019-12-31 — End: 2020-01-05

## 2020-01-22 ENCOUNTER — Ambulatory Visit: Admit: 2020-01-22 | Discharge: 2020-01-23 | Attending: Internal Medicine | Primary: Internal Medicine

## 2020-01-22 DIAGNOSIS — E0590 Thyrotoxicosis, unspecified without thyrotoxic crisis or storm: Principal | ICD-10-CM

## 2020-01-22 DIAGNOSIS — I10 Essential (primary) hypertension: Principal | ICD-10-CM

## 2020-01-22 MED ORDER — AMLODIPINE 5 MG TABLET
ORAL_TABLET | Freq: Every day | ORAL | 0 refills | 90.00000 days | Status: CP
Start: 2020-01-22 — End: 2020-04-21

## 2020-01-22 MED ORDER — MELOXICAM 15 MG TABLET
ORAL_TABLET | 0 refills | 0 days | Status: CP
Start: 2020-01-22 — End: ?

## 2020-01-22 MED ORDER — AMLODIPINE 5 MG TABLET: 5 mg | tablet | Freq: Every day | 11 refills | 30 days | Status: AC

## 2020-01-24 DIAGNOSIS — I10 Essential (primary) hypertension: Principal | ICD-10-CM

## 2020-01-24 MED ORDER — AMLODIPINE 5 MG TABLET
ORAL_TABLET | Freq: Every day | ORAL | 0 refills | 90 days | Status: CP
Start: 2020-01-24 — End: 2020-04-23

## 2020-01-29 DIAGNOSIS — M5431 Sciatica, right side: Principal | ICD-10-CM

## 2020-02-02 DIAGNOSIS — M5431 Sciatica, right side: Principal | ICD-10-CM

## 2020-02-04 MED ORDER — GABAPENTIN 600 MG TABLET
ORAL_TABLET | Freq: Every evening | ORAL | 1 refills | 30.00000 days | Status: CP
Start: 2020-02-04 — End: ?

## 2020-02-13 ENCOUNTER — Ambulatory Visit: Admit: 2020-02-13 | Discharge: 2020-02-14 | Attending: Internal Medicine | Primary: Internal Medicine

## 2020-02-13 DIAGNOSIS — F17200 Nicotine dependence, unspecified, uncomplicated: Principal | ICD-10-CM

## 2020-02-13 DIAGNOSIS — J302 Other seasonal allergic rhinitis: Principal | ICD-10-CM

## 2020-02-13 DIAGNOSIS — B20 Human immunodeficiency virus [HIV] disease: Principal | ICD-10-CM

## 2020-02-13 MED ORDER — FLUTICASONE PROPIONATE 50 MCG/ACTUATION NASAL SPRAY,SUSPENSION
Freq: Every day | NASAL | 0 refills | 0.00000 days | Status: CP
Start: 2020-02-13 — End: 2021-02-12

## 2020-02-20 DIAGNOSIS — K219 Gastro-esophageal reflux disease without esophagitis: Principal | ICD-10-CM

## 2020-02-20 DIAGNOSIS — F5101 Primary insomnia: Principal | ICD-10-CM

## 2020-02-22 MED ORDER — OMEPRAZOLE 20 MG CAPSULE,DELAYED RELEASE
ORAL_CAPSULE | 5 refills | 0 days | Status: CP
Start: 2020-02-22 — End: ?

## 2020-02-22 MED ORDER — TRAZODONE 50 MG TABLET
ORAL_TABLET | 1 refills | 0 days | Status: CP
Start: 2020-02-22 — End: ?

## 2020-03-16 DIAGNOSIS — J302 Other seasonal allergic rhinitis: Principal | ICD-10-CM

## 2020-03-17 MED ORDER — MELOXICAM 15 MG TABLET
ORAL_TABLET | 2 refills | 0 days | Status: CP
Start: 2020-03-17 — End: ?

## 2020-03-17 MED ORDER — FLUTICASONE PROPIONATE 50 MCG/ACTUATION NASAL SPRAY,SUSPENSION
2 refills | 0 days | Status: CP
Start: 2020-03-17 — End: ?

## 2020-04-02 DIAGNOSIS — M5431 Sciatica, right side: Principal | ICD-10-CM

## 2020-04-03 MED ORDER — GABAPENTIN 600 MG TABLET
ORAL_TABLET | 3 refills | 0 days | Status: CP
Start: 2020-04-03 — End: ?

## 2020-04-04 DIAGNOSIS — M5431 Sciatica, right side: Principal | ICD-10-CM

## 2020-04-04 DIAGNOSIS — I10 Essential (primary) hypertension: Principal | ICD-10-CM

## 2020-04-04 DIAGNOSIS — F5101 Primary insomnia: Principal | ICD-10-CM

## 2020-04-07 MED ORDER — AMLODIPINE 5 MG TABLET
ORAL_TABLET | Freq: Every day | ORAL | 0 refills | 90 days | Status: CP
Start: 2020-04-07 — End: 2020-07-06

## 2020-04-07 MED ORDER — GABAPENTIN 600 MG TABLET
ORAL_TABLET | Freq: Every day | ORAL | 3 refills | 30 days | Status: CP
Start: 2020-04-07 — End: 2020-05-07

## 2020-04-07 MED ORDER — TRAZODONE 50 MG TABLET
ORAL_TABLET | Freq: Every evening | ORAL | 1 refills | 30 days | Status: CP
Start: 2020-04-07 — End: 2020-05-07

## 2020-05-23 DIAGNOSIS — E782 Mixed hyperlipidemia: Principal | ICD-10-CM

## 2020-05-23 DIAGNOSIS — Z21 Asymptomatic human immunodeficiency virus [HIV] infection status: Principal | ICD-10-CM

## 2020-05-23 DIAGNOSIS — I10 Essential (primary) hypertension: Principal | ICD-10-CM

## 2020-05-23 DIAGNOSIS — F5101 Primary insomnia: Principal | ICD-10-CM

## 2020-05-24 MED ORDER — PRAVASTATIN 20 MG TABLET
ORAL_TABLET | Freq: Every day | ORAL | 5 refills | 30 days
Start: 2020-05-24 — End: 2020-06-23

## 2020-05-27 MED ORDER — TRAZODONE 50 MG TABLET
ORAL_TABLET | Freq: Every evening | ORAL | 3 refills | 30 days | Status: CP
Start: 2020-05-27 — End: 2020-06-26

## 2020-05-27 MED ORDER — BIKTARVY 50 MG-200 MG-25 MG TABLET
ORAL_TABLET | Freq: Every day | ORAL | 5 refills | 30.00000 days | Status: CP
Start: 2020-05-27 — End: ?

## 2020-05-27 MED ORDER — AMLODIPINE 5 MG TABLET
ORAL_TABLET | Freq: Every day | ORAL | 0 refills | 90 days | Status: CP
Start: 2020-05-27 — End: 2020-08-25

## 2020-06-05 ENCOUNTER — Ambulatory Visit: Admit: 2020-06-05

## 2020-06-18 ENCOUNTER — Ambulatory Visit: Admit: 2020-06-18 | Discharge: 2020-06-19 | Payer: MEDICAID

## 2020-07-28 ENCOUNTER — Ambulatory Visit: Admit: 2020-07-28 | Discharge: 2020-07-28 | Disposition: A | Discharge status: Home / Self Care | Payer: MEDICAID

## 2020-07-28 DIAGNOSIS — R630 Anorexia: Principal | ICD-10-CM

## 2020-07-28 DIAGNOSIS — R197 Diarrhea, unspecified: Principal | ICD-10-CM

## 2020-07-31 ENCOUNTER — Ambulatory Visit: Admit: 2020-07-31 | Discharge: 2020-07-31 | Attending: Internal Medicine | Primary: Internal Medicine

## 2020-07-31 ENCOUNTER — Ambulatory Visit: Admit: 2020-07-31 | Discharge: 2020-07-31

## 2020-07-31 DIAGNOSIS — E0590 Thyrotoxicosis, unspecified without thyrotoxic crisis or storm: Principal | ICD-10-CM

## 2020-07-31 DIAGNOSIS — R634 Abnormal weight loss: Principal | ICD-10-CM

## 2020-07-31 MED ORDER — HYDROXYZINE HCL 25 MG TABLET
ORAL_TABLET | Freq: Three times a day (TID) | ORAL | 0 refills | 30.00000 days | Status: CP | PRN
Start: 2020-07-31 — End: 2020-08-30

## 2020-08-12 DIAGNOSIS — K219 Gastro-esophageal reflux disease without esophagitis: Principal | ICD-10-CM

## 2020-08-15 ENCOUNTER — Ambulatory Visit: Admit: 2020-08-15 | Discharge: 2020-08-16 | Attending: Internal Medicine | Primary: Internal Medicine

## 2020-08-15 DIAGNOSIS — E0590 Thyrotoxicosis, unspecified without thyrotoxic crisis or storm: Principal | ICD-10-CM

## 2020-08-15 DIAGNOSIS — R81 Glycosuria: Principal | ICD-10-CM

## 2020-08-15 MED ORDER — METHIMAZOLE 10 MG TABLET
ORAL_TABLET | Freq: Every day | ORAL | 2 refills | 30.00000 days | Status: CP
Start: 2020-08-15 — End: 2020-11-13

## 2020-08-20 ENCOUNTER — Ambulatory Visit: Admit: 2020-08-20 | Discharge: 2020-08-21

## 2020-08-20 DIAGNOSIS — K219 Gastro-esophageal reflux disease without esophagitis: Principal | ICD-10-CM

## 2020-09-09 DIAGNOSIS — K219 Gastro-esophageal reflux disease without esophagitis: Principal | ICD-10-CM

## 2020-09-16 MED ORDER — OMEPRAZOLE 20 MG CAPSULE,DELAYED RELEASE
ORAL_CAPSULE | Freq: Every day | ORAL | 5 refills | 30 days | Status: CP
Start: 2020-09-16 — End: ?

## 2020-09-25 DIAGNOSIS — R7989 Other specified abnormal findings of blood chemistry: Principal | ICD-10-CM

## 2020-09-25 DIAGNOSIS — E782 Mixed hyperlipidemia: Principal | ICD-10-CM

## 2020-09-25 DIAGNOSIS — M7989 Other specified soft tissue disorders: Principal | ICD-10-CM

## 2020-09-25 DIAGNOSIS — I10 Essential (primary) hypertension: Principal | ICD-10-CM

## 2020-09-25 DIAGNOSIS — F5101 Primary insomnia: Principal | ICD-10-CM

## 2020-09-25 DIAGNOSIS — M79643 Pain in unspecified hand: Principal | ICD-10-CM

## 2020-09-25 MED ORDER — PRAVASTATIN 20 MG TABLET
ORAL_TABLET | 5 refills | 0 days | Status: CP
Start: 2020-09-25 — End: ?

## 2020-09-26 ENCOUNTER — Ambulatory Visit: Admit: 2020-09-26 | Discharge: 2020-09-27

## 2020-09-26 DIAGNOSIS — M79643 Pain in unspecified hand: Principal | ICD-10-CM

## 2020-09-26 DIAGNOSIS — M7989 Other specified soft tissue disorders: Principal | ICD-10-CM

## 2020-09-26 DIAGNOSIS — R7989 Other specified abnormal findings of blood chemistry: Principal | ICD-10-CM

## 2020-10-03 DIAGNOSIS — F5101 Primary insomnia: Principal | ICD-10-CM

## 2020-10-11 DIAGNOSIS — I10 Essential (primary) hypertension: Principal | ICD-10-CM

## 2020-10-11 DIAGNOSIS — F5101 Primary insomnia: Principal | ICD-10-CM

## 2020-10-13 ENCOUNTER — Ambulatory Visit: Admit: 2020-10-13 | Discharge: 2020-10-14

## 2020-10-13 ENCOUNTER — Institutional Professional Consult (permissible substitution): Admit: 2020-10-13 | Discharge: 2020-10-14 | Attending: Internal Medicine | Primary: Internal Medicine

## 2020-10-13 DIAGNOSIS — E0500 Thyrotoxicosis with diffuse goiter without thyrotoxic crisis or storm: Principal | ICD-10-CM

## 2020-10-13 DIAGNOSIS — Z0000 Encounter for general adult medical examination without abnormal findings: Principal | ICD-10-CM

## 2020-10-13 DIAGNOSIS — I10 Essential (primary) hypertension: Principal | ICD-10-CM

## 2020-10-13 DIAGNOSIS — M25542 Pain in joints of left hand: Principal | ICD-10-CM

## 2020-10-13 DIAGNOSIS — M25541 Pain in joints of right hand: Principal | ICD-10-CM

## 2020-10-13 DIAGNOSIS — F5101 Primary insomnia: Principal | ICD-10-CM

## 2020-10-13 MED ORDER — TRAZODONE 50 MG TABLET
ORAL_TABLET | Freq: Every evening | ORAL | 3 refills | 30.00000 days | Status: CP
Start: 2020-10-13 — End: 2021-01-09

## 2020-10-13 MED ORDER — AMLODIPINE 5 MG TABLET
ORAL_TABLET | Freq: Every day | ORAL | 0 refills | 90.00000 days | Status: CP
Start: 2020-10-13 — End: 2021-01-11

## 2020-10-21 DIAGNOSIS — E0590 Thyrotoxicosis, unspecified without thyrotoxic crisis or storm: Principal | ICD-10-CM

## 2020-10-21 MED ORDER — METHIMAZOLE 5 MG TABLET
ORAL_TABLET | Freq: Every day | ORAL | 2 refills | 30 days | Status: CP
Start: 2020-10-21 — End: 2021-01-19

## 2020-10-30 ENCOUNTER — Ambulatory Visit: Admit: 2020-10-30 | Discharge: 2020-10-30 | Attending: Internal Medicine | Primary: Internal Medicine

## 2020-10-30 DIAGNOSIS — F320 Major depressive disorder, single episode, mild: Principal | ICD-10-CM

## 2020-10-30 DIAGNOSIS — M25532 Pain in left wrist: Principal | ICD-10-CM

## 2020-10-30 DIAGNOSIS — Z0000 Encounter for general adult medical examination without abnormal findings: Principal | ICD-10-CM

## 2020-10-30 DIAGNOSIS — M25531 Pain in right wrist: Principal | ICD-10-CM

## 2020-10-30 DIAGNOSIS — R7989 Other specified abnormal findings of blood chemistry: Principal | ICD-10-CM

## 2020-10-30 DIAGNOSIS — M79641 Pain in right hand: Principal | ICD-10-CM

## 2020-10-30 DIAGNOSIS — J302 Other seasonal allergic rhinitis: Principal | ICD-10-CM

## 2020-10-30 DIAGNOSIS — M79642 Pain in left hand: Principal | ICD-10-CM

## 2020-10-30 DIAGNOSIS — M5431 Sciatica, right side: Principal | ICD-10-CM

## 2020-10-30 MED ORDER — GABAPENTIN 600 MG TABLET
ORAL_TABLET | Freq: Every day | ORAL | 3 refills | 30.00000 days | Status: CP
Start: 2020-10-30 — End: 2021-01-09

## 2020-10-30 MED ORDER — FLUTICASONE PROPIONATE 50 MCG/ACTUATION NASAL SPRAY,SUSPENSION
Freq: Every day | NASAL | 0 refills | 0 days | Status: CP
Start: 2020-10-30 — End: 2021-10-30

## 2020-11-06 MED ORDER — AMLODIPINE 5 MG TABLET
ORAL_TABLET | Freq: Every day | ORAL | 0 refills | 90.00000 days | Status: CP
Start: 2020-11-06 — End: 2021-02-04

## 2020-11-07 ENCOUNTER — Ambulatory Visit: Admit: 2020-11-07 | Discharge: 2020-11-08

## 2020-11-07 DIAGNOSIS — F17200 Nicotine dependence, unspecified, uncomplicated: Principal | ICD-10-CM

## 2020-11-07 DIAGNOSIS — Z0000 Encounter for general adult medical examination without abnormal findings: Principal | ICD-10-CM

## 2020-11-07 DIAGNOSIS — B20 Human immunodeficiency virus [HIV] disease: Principal | ICD-10-CM

## 2020-11-07 DIAGNOSIS — E0590 Thyrotoxicosis, unspecified without thyrotoxic crisis or storm: Principal | ICD-10-CM

## 2020-11-07 DIAGNOSIS — M25549 Pain in joints of unspecified hand: Principal | ICD-10-CM

## 2020-11-07 DIAGNOSIS — M25542 Pain in joints of left hand: Principal | ICD-10-CM

## 2020-11-07 DIAGNOSIS — M2550 Pain in unspecified joint: Principal | ICD-10-CM

## 2020-11-07 DIAGNOSIS — N898 Other specified noninflammatory disorders of vagina: Principal | ICD-10-CM

## 2020-11-07 DIAGNOSIS — F3341 Major depressive disorder, recurrent, in partial remission: Principal | ICD-10-CM

## 2020-11-07 DIAGNOSIS — G8929 Other chronic pain: Principal | ICD-10-CM

## 2020-11-07 DIAGNOSIS — M25572 Pain in left ankle and joints of left foot: Principal | ICD-10-CM

## 2020-11-07 DIAGNOSIS — N760 Acute vaginitis: Principal | ICD-10-CM

## 2020-11-07 DIAGNOSIS — I10 Essential (primary) hypertension: Principal | ICD-10-CM

## 2020-11-07 DIAGNOSIS — M25539 Pain in unspecified wrist: Principal | ICD-10-CM

## 2020-11-07 DIAGNOSIS — M25541 Pain in joints of right hand: Principal | ICD-10-CM

## 2020-11-07 DIAGNOSIS — N879 Dysplasia of cervix uteri, unspecified: Principal | ICD-10-CM

## 2020-11-07 DIAGNOSIS — Z124 Encounter for screening for malignant neoplasm of cervix: Principal | ICD-10-CM

## 2020-11-07 DIAGNOSIS — M8580 Other specified disorders of bone density and structure, unspecified site: Principal | ICD-10-CM

## 2020-11-07 DIAGNOSIS — B9689 Other specified bacterial agents as the cause of diseases classified elsewhere: Principal | ICD-10-CM

## 2020-11-07 MED ORDER — PREGABALIN 75 MG CAPSULE
ORAL_CAPSULE | Freq: Every morning | ORAL | 11 refills | 30.00000 days | Status: CP
Start: 2020-11-07 — End: 2020-11-07

## 2020-11-07 MED ORDER — DULOXETINE 30 MG CAPSULE,DELAYED RELEASE
ORAL_CAPSULE | Freq: Every day | ORAL | 5 refills | 30 days | Status: CP
Start: 2020-11-07 — End: 2021-05-06

## 2020-11-07 MED ORDER — PREGABALIN 75 MG CAPSULE: 75 mg | capsule | Freq: Every morning | 11 refills | 30 days | Status: AC

## 2020-11-07 MED ORDER — METHOCARBAMOL 500 MG TABLET
ORAL_TABLET | Freq: Two times a day (BID) | ORAL | 3 refills | 15 days | Status: CP | PRN
Start: 2020-11-07 — End: ?

## 2020-11-07 MED ORDER — FLUCONAZOLE 150 MG TABLET
ORAL_TABLET | Freq: Every day | ORAL | 0 refills | 1 days | Status: CP
Start: 2020-11-07 — End: 2020-11-08

## 2020-11-07 MED ORDER — METRONIDAZOLE 500 MG TABLET
ORAL_TABLET | Freq: Two times a day (BID) | ORAL | 0 refills | 7.00000 days | Status: CP
Start: 2020-11-07 — End: 2020-11-14

## 2020-11-25 ENCOUNTER — Ambulatory Visit: Admit: 2020-11-25 | Discharge: 2020-11-26

## 2020-11-25 DIAGNOSIS — Z885 Allergy status to narcotic agent status: Principal | ICD-10-CM

## 2020-11-25 DIAGNOSIS — E785 Hyperlipidemia, unspecified: Principal | ICD-10-CM

## 2020-11-25 DIAGNOSIS — F32A Depression, unspecified: Principal | ICD-10-CM

## 2020-11-25 DIAGNOSIS — F17210 Nicotine dependence, cigarettes, uncomplicated: Principal | ICD-10-CM

## 2020-11-25 DIAGNOSIS — M25532 Pain in left wrist: Principal | ICD-10-CM

## 2020-11-25 DIAGNOSIS — I10 Essential (primary) hypertension: Principal | ICD-10-CM

## 2020-11-25 DIAGNOSIS — J449 Chronic obstructive pulmonary disease, unspecified: Principal | ICD-10-CM

## 2020-11-25 DIAGNOSIS — M25531 Pain in right wrist: Principal | ICD-10-CM

## 2020-11-25 DIAGNOSIS — R202 Paresthesia of skin: Principal | ICD-10-CM

## 2020-11-25 DIAGNOSIS — R768 Other specified abnormal immunological findings in serum: Principal | ICD-10-CM

## 2020-11-25 DIAGNOSIS — K029 Dental caries, unspecified: Principal | ICD-10-CM

## 2020-11-25 DIAGNOSIS — M79602 Pain in left arm: Principal | ICD-10-CM

## 2020-11-25 DIAGNOSIS — Z21 Asymptomatic human immunodeficiency virus [HIV] infection status: Principal | ICD-10-CM

## 2020-11-25 DIAGNOSIS — E039 Hypothyroidism, unspecified: Principal | ICD-10-CM

## 2020-12-12 DIAGNOSIS — Z21 Asymptomatic human immunodeficiency virus [HIV] infection status: Principal | ICD-10-CM

## 2020-12-14 DIAGNOSIS — Z21 Asymptomatic human immunodeficiency virus [HIV] infection status: Principal | ICD-10-CM

## 2020-12-17 MED ORDER — BIKTARVY 50 MG-200 MG-25 MG TABLET
ORAL_TABLET | Freq: Every day | ORAL | 5 refills | 30 days | Status: CP
Start: 2020-12-17 — End: ?

## 2021-01-09 ENCOUNTER — Institutional Professional Consult (permissible substitution): Admit: 2021-01-09 | Discharge: 2021-01-10

## 2021-02-06 ENCOUNTER — Ambulatory Visit
Admit: 2021-02-06 | Discharge: 2021-02-06 | Disposition: A | Discharge status: Home / Self Care | Payer: MEDICAID | Attending: Emergency Medicine

## 2021-02-06 DIAGNOSIS — N898 Other specified noninflammatory disorders of vagina: Principal | ICD-10-CM

## 2021-02-06 DIAGNOSIS — M791 Myalgia, unspecified site: Principal | ICD-10-CM

## 2021-02-19 DIAGNOSIS — F5101 Primary insomnia: Principal | ICD-10-CM

## 2021-02-19 MED ORDER — TRAZODONE 50 MG TABLET
ORAL_TABLET | Freq: Every evening | ORAL | 11 refills | 30 days | Status: CP
Start: 2021-02-19 — End: 2021-03-21

## 2021-03-09 DIAGNOSIS — F5101 Primary insomnia: Principal | ICD-10-CM

## 2021-03-10 MED ORDER — AMLODIPINE 5 MG TABLET
ORAL_TABLET | Freq: Every day | ORAL | 2 refills | 90 days | Status: CP
Start: 2021-03-10 — End: 2021-06-08

## 2021-03-16 DIAGNOSIS — M5431 Sciatica, right side: Principal | ICD-10-CM

## 2021-03-16 MED ORDER — GABAPENTIN 600 MG TABLET
ORAL_TABLET | Freq: Every day | ORAL | 2 refills | 30 days | Status: CP
Start: 2021-03-16 — End: 2021-04-15

## 2021-03-17 MED ORDER — GABAPENTIN 600 MG TABLET
ORAL_TABLET | Freq: Every day | ORAL | 5 refills | 30.00000 days | Status: CP
Start: 2021-03-17 — End: 2021-09-13

## 2021-03-25 ENCOUNTER — Ambulatory Visit: Admit: 2021-03-25 | Discharge: 2021-03-25 | Attending: Internal Medicine | Primary: Internal Medicine

## 2021-03-25 ENCOUNTER — Ambulatory Visit: Admit: 2021-03-25 | Discharge: 2021-03-25

## 2021-03-25 DIAGNOSIS — Z720 Tobacco use: Principal | ICD-10-CM

## 2021-03-25 DIAGNOSIS — M25542 Pain in joints of left hand: Principal | ICD-10-CM

## 2021-03-25 DIAGNOSIS — F1021 Alcohol dependence, in remission: Principal | ICD-10-CM

## 2021-03-25 DIAGNOSIS — I10 Essential (primary) hypertension: Principal | ICD-10-CM

## 2021-03-25 DIAGNOSIS — M2550 Pain in unspecified joint: Principal | ICD-10-CM

## 2021-03-25 DIAGNOSIS — B20 Human immunodeficiency virus [HIV] disease: Principal | ICD-10-CM

## 2021-03-25 DIAGNOSIS — F3341 Major depressive disorder, recurrent, in partial remission: Principal | ICD-10-CM

## 2021-03-25 DIAGNOSIS — M25541 Pain in joints of right hand: Principal | ICD-10-CM

## 2021-03-25 DIAGNOSIS — Z113 Encounter for screening for infections with a predominantly sexual mode of transmission: Principal | ICD-10-CM

## 2021-03-25 DIAGNOSIS — M797 Fibromyalgia: Principal | ICD-10-CM

## 2021-03-25 DIAGNOSIS — N879 Dysplasia of cervix uteri, unspecified: Principal | ICD-10-CM

## 2021-03-25 DIAGNOSIS — M8949 Other hypertrophic osteoarthropathy, multiple sites: Principal | ICD-10-CM

## 2021-03-25 DIAGNOSIS — E0590 Thyrotoxicosis, unspecified without thyrotoxic crisis or storm: Principal | ICD-10-CM

## 2021-03-25 DIAGNOSIS — F17200 Nicotine dependence, unspecified, uncomplicated: Principal | ICD-10-CM

## 2021-03-25 DIAGNOSIS — M79642 Pain in left hand: Principal | ICD-10-CM

## 2021-03-25 DIAGNOSIS — M79641 Pain in right hand: Principal | ICD-10-CM

## 2021-03-25 DIAGNOSIS — Z1211 Encounter for screening for malignant neoplasm of colon: Principal | ICD-10-CM

## 2021-03-25 MED ORDER — DULOXETINE 60 MG CAPSULE,DELAYED RELEASE
ORAL_CAPSULE | Freq: Every day | ORAL | 3 refills | 90.00000 days | Status: CP
Start: 2021-03-25 — End: 2022-03-20

## 2021-03-25 MED ORDER — CHANTIX STARTING MONTH BOX 0.5 MG (11)-1 MG (42) TABLETS IN DOSE PACK
ORAL_TABLET | 0 refills | 0 days | Status: CP
Start: 2021-03-25 — End: ?

## 2021-03-25 MED ORDER — AMLODIPINE 10 MG TABLET
ORAL_TABLET | Freq: Every day | ORAL | 3 refills | 90 days | Status: CP
Start: 2021-03-25 — End: 2021-06-23

## 2021-03-26 MED ORDER — FLUTICASONE PROPIONATE 50 MCG/ACTUATION NASAL SPRAY,SUSPENSION
Freq: Every day | NASAL | 3 refills | 0.00000 days | Status: CP
Start: 2021-03-26 — End: 2022-03-26

## 2021-04-08 DIAGNOSIS — K219 Gastro-esophageal reflux disease without esophagitis: Principal | ICD-10-CM

## 2021-04-08 MED ORDER — OMEPRAZOLE 20 MG CAPSULE,DELAYED RELEASE
ORAL_CAPSULE | Freq: Every day | ORAL | 5 refills | 30 days | Status: CP
Start: 2021-04-08 — End: ?

## 2021-04-09 DIAGNOSIS — E782 Mixed hyperlipidemia: Principal | ICD-10-CM

## 2021-04-09 MED ORDER — PRAVASTATIN 20 MG TABLET
ORAL_TABLET | Freq: Every day | ORAL | 5 refills | 30 days | Status: CP
Start: 2021-04-09 — End: ?

## 2021-04-21 ENCOUNTER — Ambulatory Visit: Admit: 2021-04-21

## 2021-04-29 ENCOUNTER — Ambulatory Visit
Admit: 2021-04-29 | Discharge: 2021-04-29 | Disposition: A | Discharge status: Home / Self Care | Payer: MEDICAID | Attending: Emergency Medicine

## 2021-04-29 DIAGNOSIS — R519 Nonintractable headache, unspecified chronicity pattern, unspecified headache type: Principal | ICD-10-CM

## 2021-04-29 DIAGNOSIS — H8112 Benign paroxysmal vertigo, left ear: Principal | ICD-10-CM

## 2021-04-29 MED ORDER — MECLIZINE 25 MG TABLET
ORAL_TABLET | Freq: Three times a day (TID) | ORAL | 0 refills | 10 days | Status: CP | PRN
Start: 2021-04-29 — End: 2021-05-29

## 2021-05-05 ENCOUNTER — Ambulatory Visit: Admit: 2021-05-05 | Attending: Internal Medicine | Primary: Internal Medicine

## 2021-06-02 DIAGNOSIS — M2550 Pain in unspecified joint: Principal | ICD-10-CM

## 2021-06-02 MED ORDER — PREGABALIN 75 MG CAPSULE
ORAL_CAPSULE | Freq: Every morning | ORAL | 9 refills | 30 days | Status: CP
Start: 2021-06-02 — End: 2022-04-02

## 2021-06-04 ENCOUNTER — Emergency Department
Admission: EM | Admit: 2021-06-04 | Discharge: 2021-06-04 | Disposition: A | Discharge status: Home / Self Care | Payer: Self-pay | Attending: Emergency Medicine | Admitting: Emergency Medicine

## 2021-06-04 ENCOUNTER — Emergency Department: Payer: Self-pay

## 2021-06-04 ENCOUNTER — Other Ambulatory Visit: Payer: Self-pay

## 2021-06-04 DIAGNOSIS — W010XXA Fall on same level from slipping, tripping and stumbling without subsequent striking against object, initial encounter: Secondary | ICD-10-CM | POA: Insufficient documentation

## 2021-06-04 DIAGNOSIS — M25562 Pain in left knee: Secondary | ICD-10-CM | POA: Insufficient documentation

## 2021-06-04 DIAGNOSIS — M79672 Pain in left foot: Secondary | ICD-10-CM | POA: Insufficient documentation

## 2021-06-04 DIAGNOSIS — M25561 Pain in right knee: Secondary | ICD-10-CM | POA: Insufficient documentation

## 2021-06-04 DIAGNOSIS — M7918 Myalgia, other site: Secondary | ICD-10-CM

## 2021-06-04 DIAGNOSIS — R0781 Pleurodynia: Secondary | ICD-10-CM | POA: Insufficient documentation

## 2021-06-04 MED ORDER — TIZANIDINE HCL 4 MG PO TABS: 4.0000 mg | ORAL_TABLET | Freq: Three times a day (TID) | ORAL | 0 refills | Status: AC

## 2021-06-04 MED ORDER — HYDROCODONE-ACETAMINOPHEN 5-325 MG PO TABS
1.0000 | ORAL_TABLET | Freq: Once | ORAL | Status: AC
  Administered 2021-06-04: 1 via ORAL
  Filled 2021-06-04: qty 1

## 2021-06-04 MED ORDER — NAPROXEN 500 MG PO TABS
500.0000 mg | ORAL_TABLET | Freq: Once | ORAL | Status: DC
  Filled 2021-06-04: qty 1

## 2021-06-04 NOTE — ED Provider Notes (Signed)
[facility] Emergency Department Provider Note ____________________________________________  Time seen: Approximately 9:02 PM  I have reviewed the triage vital signs and the nursing notes.   HISTORY  Chief Complaint Fall    HPI Taylor Mahoney is a 55 y.o. female who presents to the emergency department for evaluation and treatment of left rib, left knee, left foot, and right knee pain after mechanical, nonsyncopal fall earlier this evening.  She denies striking her head or losing consciousness.  No alleviating measures attempted prior to arrival.  No past medical history on file.  There are no problems to display for this patient.   Prior to Admission medications   Medication Sig Start Date End Date Taking? Authorizing Provider  tiZANidine (ZANAFLEX) 4 MG tablet Take 1 tablet (4 mg total) by mouth 3 (three) times daily. 06/04/21  Yes Paris Chiriboga B, FNP    Allergies Codeine and Tape  No family history on file.  Social History    Review of Systems Constitutional: Negative for fever. Cardiovascular: Negative for chest pain. Respiratory: Negative for shortness of breath. Musculoskeletal: Left rib pain, left knee pain, left foot pain, right knee pain Skin: Negative for open wound related to fall Neurological: Negative for decrease in sensation  ____________________________________________   PHYSICAL EXAM:  VITAL SIGNS: ED Triage Vitals [06/04/21 2014]  Enc Vitals Group     BP 126/75     Pulse Rate 89     Resp 20     Temp 98 F (36.7 C)     Temp Source Oral     SpO2 96 %     Weight 159 lb (72.1 kg)     Height 5\' 4"  (1.626 m)     Head Circumference      Peak Flow      Pain Score 10     Pain Loc      Pain Edu?      Excl. in GC?     Constitutional: Alert and oriented. Well appearing and in no acute distress. Eyes: Conjunctivae are clear without discharge or drainage Head: Atraumatic Neck: Supple.  No focal midline  tenderness. Respiratory: No cough. Respirations are even and unlabored. Musculoskeletal: Tenderness in the left lateral lower ribs.  Pelvis is stable on exam and no pain in either hip.  Diffuse tenderness over the left knee.  Tenderness over the left foot from the toes to the ankle. Neurologic: Motor and sensory functions intact. Skin: No open wounds or lesions.  Mild ecchymosis Psychiatric: Affect and behavior are appropriate.  ____________________________________________   LABS (all labs ordered are listed, but only abnormal results are displayed)  Labs Reviewed - No data to display ____________________________________________  RADIOLOGY  Images of the left ribs and chest, bilateral knees, and left foot are all negative for acute bony abnormality.  I, , personally viewed and evaluated these images (plain radiographs) as part of my medical decision making, as well as reviewing the written report by the radiologist.  DG Ribs Unilateral W/Chest Left  Result Date: 06/04/2021 CLINICAL DATA:  Fall while walking with rib pain, initial encounter EXAM: LEFT RIBS AND CHEST - 3+ VIEW COMPARISON:  None. FINDINGS: Cardiac shadow is within normal limits. The lungs are well aerated bilaterally. No focal infiltrate, effusion or pneumothorax is noted. Ribcage is well visualized without evidence of acute abnormality. IMPRESSION: No evidence of left rib fracture or complicating factors. Electronically Signed   By: 06/06/2021 M.D.   On: 06/04/2021 21:20   DG Knee  Complete 4 Views Left  Result Date: 06/04/2021 CLINICAL DATA:  Recent fall with left knee pain, initial encounter EXAM: LEFT KNEE - COMPLETE 4+ VIEW COMPARISON:  None. FINDINGS: No evidence of fracture, dislocation, or joint effusion. No evidence of arthropathy or other focal bone abnormality. Soft tissues are unremarkable. IMPRESSION: No acute abnormality noted. Electronically Signed   By: Alcide Clever M.D.   On: 06/04/2021 21:17    DG Knee Complete 4 Views Right  Result Date: 06/04/2021 CLINICAL DATA:  Recent trip and fall with right knee pain, initial encounter EXAM: RIGHT KNEE - COMPLETE 4+ VIEW COMPARISON:  None. FINDINGS: No evidence of fracture, dislocation, or joint effusion. No evidence of arthropathy or other focal bone abnormality. Soft tissues are unremarkable. IMPRESSION: No acute abnormality noted. Electronically Signed   By: Alcide Clever M.D.   On: 06/04/2021 21:20   DG Foot Complete Left  Result Date: 06/04/2021 CLINICAL DATA:  Trip and fall with left foot pain, initial encounter EXAM: LEFT FOOT - COMPLETE 3+ VIEW COMPARISON:  None. FINDINGS: Mild hallux valgus deformity is noted. No acute fracture or dislocation is seen. No soft tissue abnormality is noted. IMPRESSION: No acute abnormality noted. Electronically Signed   By: Alcide Clever M.D.   On: 06/04/2021 21:16   ____________________________________________   PROCEDURES  Procedures  ____________________________________________   INITIAL IMPRESSION / ASSESSMENT AND PLAN / ED COURSE  Taylor Mahoney is a 55 y.o. who presents to the emergency department for evaluation after mechanical, nonsyncopal fall this evening.  See HPI for further details.  Imaging is negative for acute bony abnormalities.  She will be treated with Zanaflex.  Patient instructed to follow-up with primary care.  She was also instructed to return to the emergency department for symptoms that change or worsen if unable schedule an appointment   Medications  naproxen (NAPROSYN) tablet 500 mg (500 mg Oral Patient Refused/Not Given 06/04/21 2109)  HYDROcodone-acetaminophen (NORCO/VICODIN) 5-325 MG per tablet 1 tablet (1 tablet Oral Given 06/04/21 2109)    Pertinent labs & imaging results that were available during my care of the patient were reviewed by me and considered in my medical decision making (see chart for details).   _________________________________________   FINAL  CLINICAL IMPRESSION(S) / ED DIAGNOSES  Final diagnoses:  Musculoskeletal pain    ED Discharge Orders          Ordered    tiZANidine (ZANAFLEX) 4 MG tablet  3 times daily        06/04/21 2200             If controlled substance prescribed during this visit, 12 month history viewed on the [facility] prior to issuing an initial prescription for Schedule II or III opiod.    Chinita Pester, FNP 06/04/21 2220    Chesley Noon, MD 06/04/21 [phone]

## 2021-06-04 NOTE — ED Triage Notes (Signed)
Pt was walking outside and tripped on something on the floor and fell onto ground. PT co left foot, left knee and left rib pain.

## 2021-06-04 NOTE — Discharge Instructions (Signed)
Follow-up with your primary care provider for symptoms that are not improving over the week.  Return to the emergency department for symptoms change or worsen if you are unable to schedule appointment.

## 2021-06-05 ENCOUNTER — Ambulatory Visit: Admit: 2021-06-05 | Attending: Family | Primary: Family

## 2021-06-08 ENCOUNTER — Ambulatory Visit: Admit: 2021-06-08 | Discharge: 2021-06-08 | Disposition: A | Discharge status: Home / Self Care | Payer: MEDICAID

## 2021-06-08 ENCOUNTER — Emergency Department: Admit: 2021-06-08 | Discharge: 2021-06-08 | Disposition: A | Discharge status: Home / Self Care | Payer: MEDICAID

## 2021-06-08 DIAGNOSIS — S20212D Contusion of left front wall of thorax, subsequent encounter: Principal | ICD-10-CM

## 2021-06-08 DIAGNOSIS — M25562 Pain in left knee: Principal | ICD-10-CM

## 2021-06-16 DIAGNOSIS — I10 Essential (primary) hypertension: Principal | ICD-10-CM

## 2021-06-19 DIAGNOSIS — I10 Essential (primary) hypertension: Principal | ICD-10-CM

## 2021-07-03 ENCOUNTER — Ambulatory Visit: Admit: 2021-07-03 | Discharge: 2021-07-04 | Payer: MEDICAID

## 2021-07-06 ENCOUNTER — Ambulatory Visit: Admit: 2021-07-06 | Discharge: 2021-07-07

## 2021-07-06 DIAGNOSIS — E069 Thyroiditis, unspecified: Principal | ICD-10-CM

## 2021-07-08 DIAGNOSIS — E0500 Thyrotoxicosis with diffuse goiter without thyrotoxic crisis or storm: Principal | ICD-10-CM

## 2021-07-08 MED ORDER — METHIMAZOLE 5 MG TABLET
ORAL_TABLET | Freq: Three times a day (TID) | ORAL | 11 refills | 30 days | Status: CP
Start: 2021-07-08 — End: 2022-07-08

## 2021-07-09 ENCOUNTER — Ambulatory Visit: Admit: 2021-07-09 | Discharge: 2021-07-10 | Attending: Family | Primary: Family

## 2021-07-09 DIAGNOSIS — Z21 Asymptomatic human immunodeficiency virus [HIV] infection status: Principal | ICD-10-CM

## 2021-07-09 DIAGNOSIS — E0500 Thyrotoxicosis with diffuse goiter without thyrotoxic crisis or storm: Principal | ICD-10-CM

## 2021-07-09 DIAGNOSIS — Z113 Encounter for screening for infections with a predominantly sexual mode of transmission: Principal | ICD-10-CM

## 2021-07-09 DIAGNOSIS — Z0000 Encounter for general adult medical examination without abnormal findings: Principal | ICD-10-CM

## 2021-07-09 MED ORDER — BIKTARVY 50 MG-200 MG-25 MG TABLET
ORAL_TABLET | Freq: Every day | ORAL | 5 refills | 30 days | Status: CP
Start: 2021-07-09 — End: ?

## 2021-07-22 ENCOUNTER — Ambulatory Visit: Admit: 2021-07-22 | Discharge: 2021-07-23

## 2021-07-22 DIAGNOSIS — M25541 Pain in joints of right hand: Principal | ICD-10-CM

## 2021-07-22 DIAGNOSIS — I10 Essential (primary) hypertension: Principal | ICD-10-CM

## 2021-07-22 DIAGNOSIS — M5441 Lumbago with sciatica, right side: Principal | ICD-10-CM

## 2021-07-22 DIAGNOSIS — B20 Human immunodeficiency virus [HIV] disease: Principal | ICD-10-CM

## 2021-07-22 DIAGNOSIS — M8580 Other specified disorders of bone density and structure, unspecified site: Principal | ICD-10-CM

## 2021-07-22 DIAGNOSIS — M5442 Lumbago with sciatica, left side: Principal | ICD-10-CM

## 2021-07-22 DIAGNOSIS — M25542 Pain in joints of left hand: Principal | ICD-10-CM

## 2021-07-22 DIAGNOSIS — E0590 Thyrotoxicosis, unspecified without thyrotoxic crisis or storm: Principal | ICD-10-CM

## 2021-07-22 DIAGNOSIS — G8929 Other chronic pain: Principal | ICD-10-CM

## 2021-07-22 DIAGNOSIS — Z720 Tobacco use: Principal | ICD-10-CM

## 2021-07-22 DIAGNOSIS — Z1211 Encounter for screening for malignant neoplasm of colon: Principal | ICD-10-CM

## 2021-07-22 DIAGNOSIS — M2550 Pain in unspecified joint: Principal | ICD-10-CM

## 2021-07-22 DIAGNOSIS — F17200 Nicotine dependence, unspecified, uncomplicated: Principal | ICD-10-CM

## 2021-07-22 MED ORDER — AMLODIPINE 10 MG TABLET
ORAL_TABLET | Freq: Every day | ORAL | 3 refills | 90 days | Status: CP
Start: 2021-07-22 — End: 2022-07-17

## 2021-07-22 MED ORDER — PREGABALIN 75 MG CAPSULE
ORAL_CAPSULE | Freq: Two times a day (BID) | ORAL | 3 refills | 90 days | Status: CP
Start: 2021-07-22 — End: 2022-05-22

## 2021-07-22 MED ORDER — VARENICLINE 1 MG TABLET
ORAL_TABLET | ORAL | 0 refills | 30.00000 days | Status: CP
Start: 2021-07-22 — End: 2021-08-21

## 2021-07-22 MED ORDER — LOSARTAN 50 MG TABLET
ORAL_TABLET | Freq: Every day | ORAL | 3 refills | 90 days | Status: CP
Start: 2021-07-22 — End: 2022-07-22

## 2021-07-28 DIAGNOSIS — I10 Essential (primary) hypertension: Principal | ICD-10-CM

## 2021-07-28 MED ORDER — OLMESARTAN 20 MG TABLET
ORAL_TABLET | Freq: Every day | ORAL | 11 refills | 30 days | Status: CP
Start: 2021-07-28 — End: 2022-07-28

## 2021-08-21 IMAGING — CR DG FOOT COMPLETE 3+V*L*
3 series · 3 of 3 positions shown · non-contrast
Comparison: None.

CLINICAL DATA: Trip and fall with left foot pain, initial encounter

EXAM:
LEFT FOOT - COMPLETE 3+ VIEW

[foot ap]
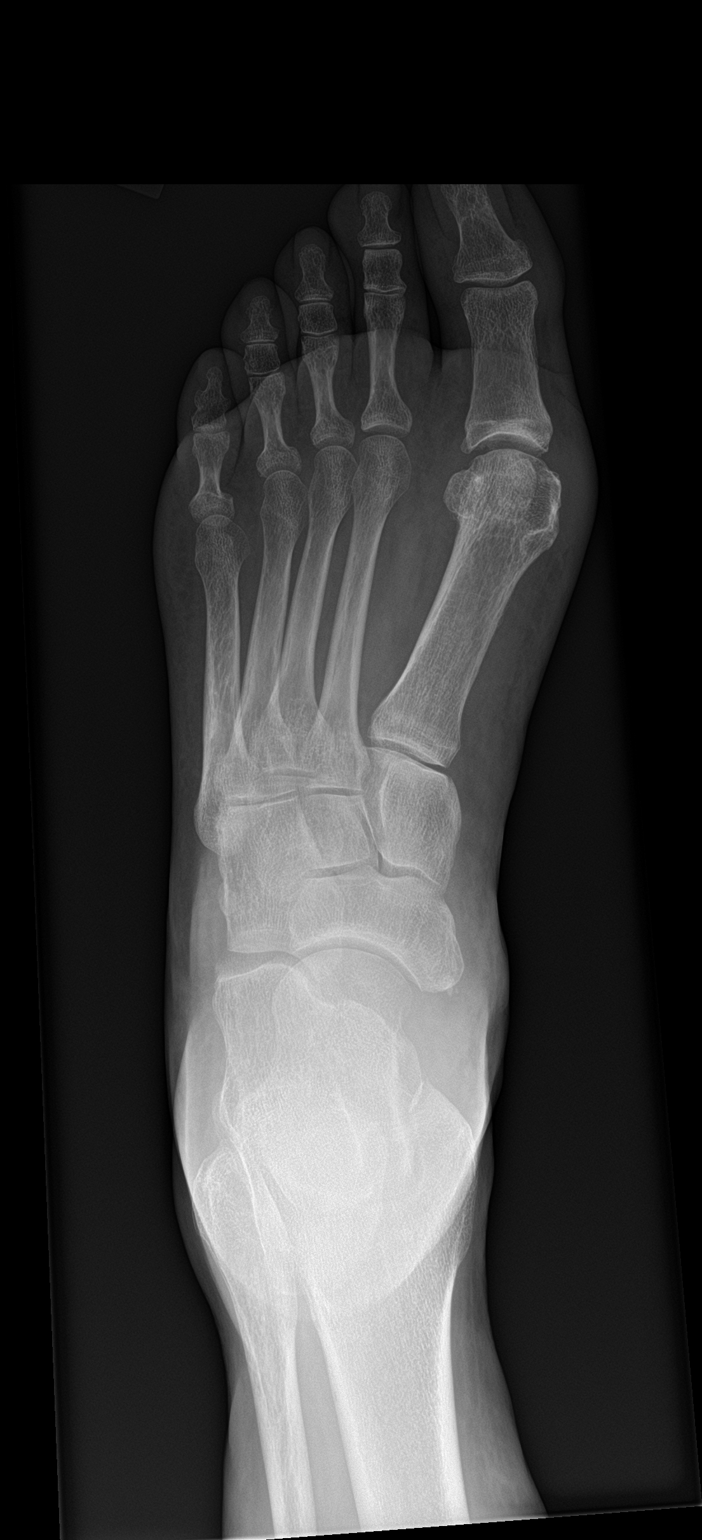

[foot obl]
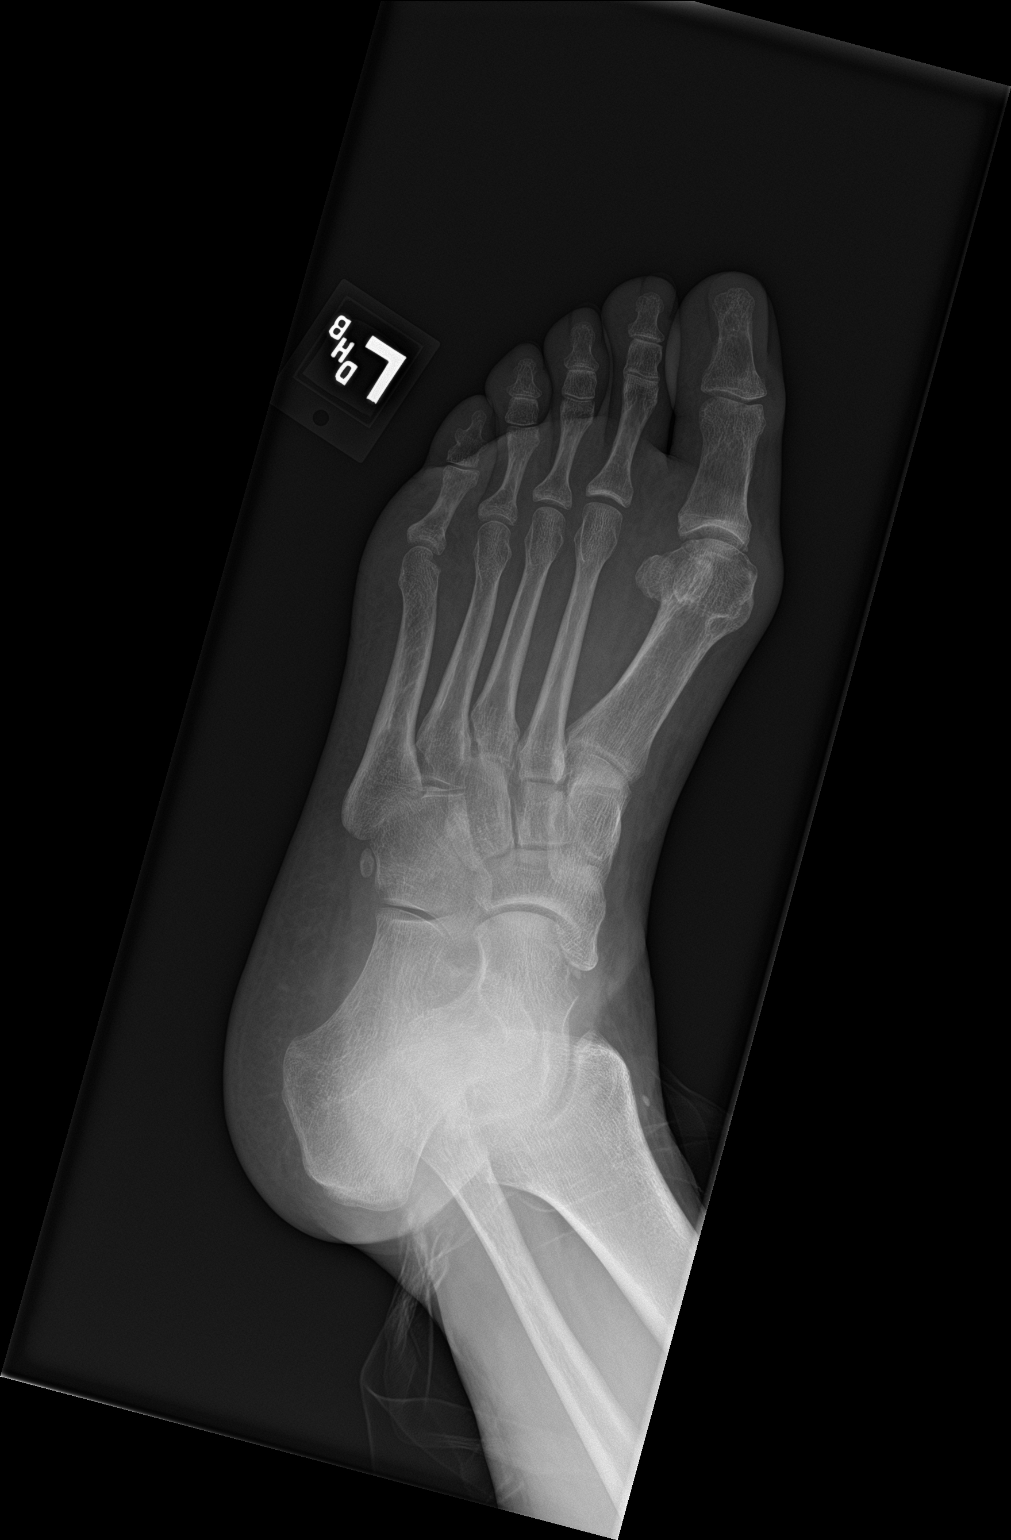

[foot lat]
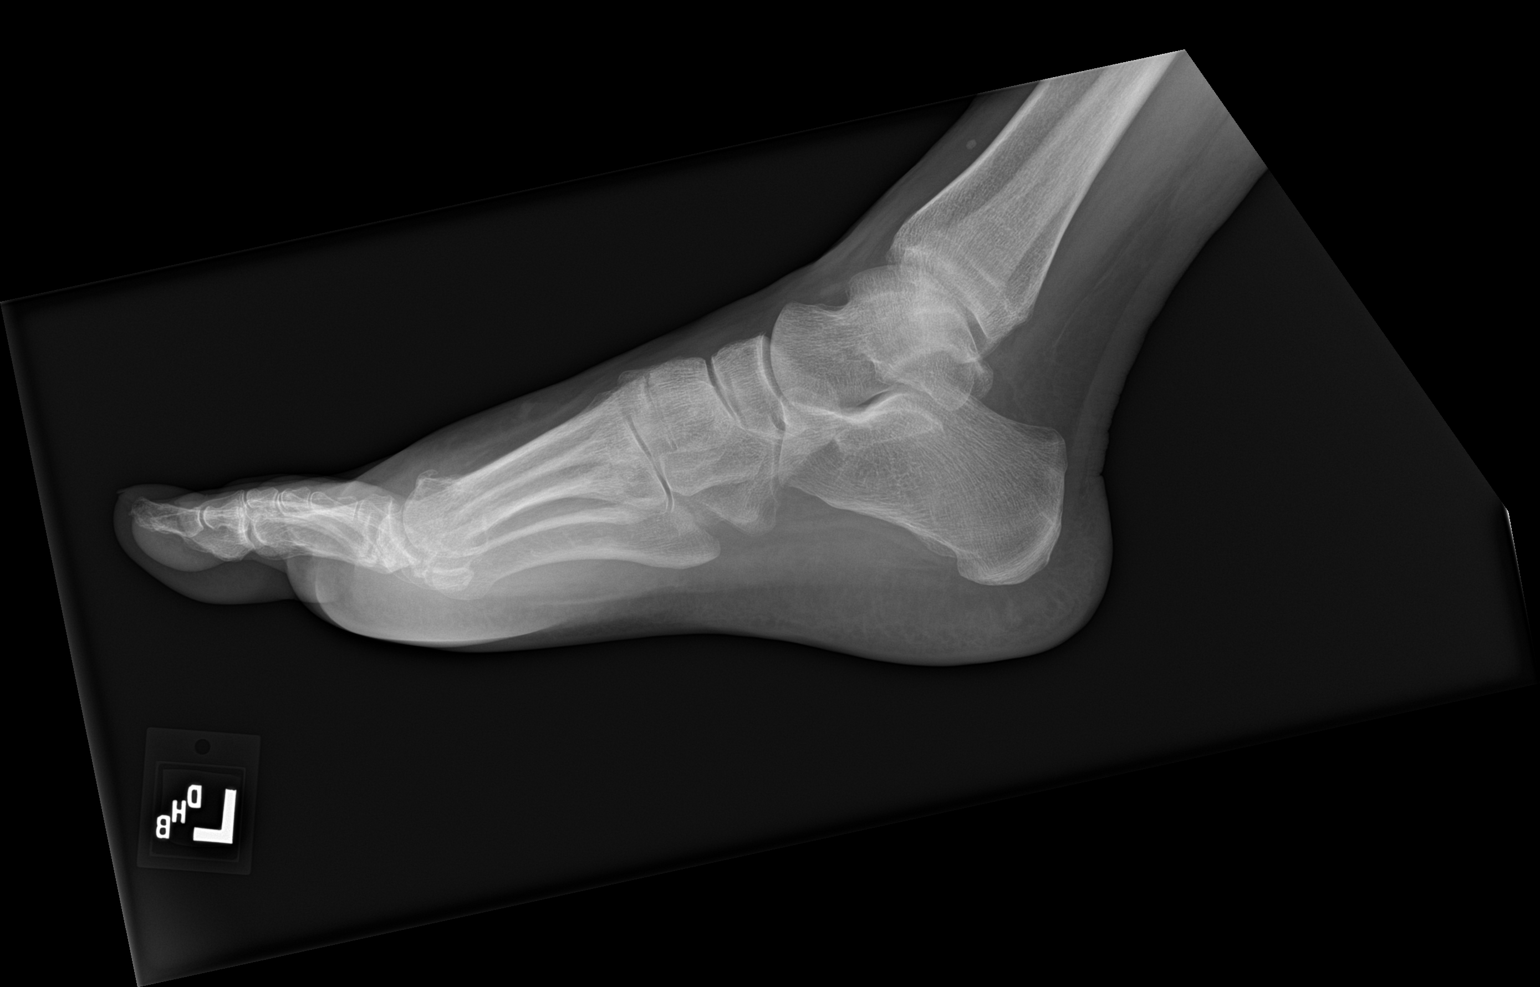

[3 of 3 positions shown; findings below may reference images not displayed]

FINDINGS: Mild hallux valgus deformity is noted. No acute fracture or
dislocation is seen. No soft tissue abnormality is noted.
IMPRESSION: No acute abnormality noted.

## 2021-08-28 ENCOUNTER — Ambulatory Visit
Admit: 2021-08-28 | Attending: Pharmacist Clinician (PhC)/ Clinical Pharmacy Specialist | Primary: Pharmacist Clinician (PhC)/ Clinical Pharmacy Specialist

## 2021-08-31 ENCOUNTER — Ambulatory Visit
Admit: 2021-08-31 | Attending: Student in an Organized Health Care Education/Training Program | Primary: Student in an Organized Health Care Education/Training Program

## 2021-09-10 ENCOUNTER — Ambulatory Visit: Admit: 2021-09-10

## 2021-09-20 MED ORDER — VARENICLINE 1 MG TABLET
ORAL_TABLET | Freq: Two times a day (BID) | ORAL | 2 refills | 30.00000 days | Status: CP
Start: 2021-09-20 — End: 2021-12-19

## 2021-09-24 ENCOUNTER — Ambulatory Visit: Admit: 2021-09-24 | Discharge: 2021-09-24 | Disposition: A | Discharge status: Home / Self Care | Payer: MEDICAID

## 2021-09-24 DIAGNOSIS — L0231 Cutaneous abscess of buttock: Principal | ICD-10-CM

## 2021-09-24 MED ORDER — CLINDAMYCIN HCL 300 MG CAPSULE
ORAL_CAPSULE | Freq: Four times a day (QID) | ORAL | 0 refills | 7.00000 days | Status: CP
Start: 2021-09-24 — End: 2021-10-01

## 2021-09-24 MED ORDER — FLUCONAZOLE 150 MG TABLET
ORAL_TABLET | Freq: Once | ORAL | 0 refills | 1.00000 days | Status: CP
Start: 2021-09-24 — End: 2021-09-24

## 2021-10-20 ENCOUNTER — Ambulatory Visit: Admit: 2021-10-20

## 2021-10-21 ENCOUNTER — Ambulatory Visit: Admit: 2021-10-21

## 2021-11-13 DIAGNOSIS — K219 Gastro-esophageal reflux disease without esophagitis: Principal | ICD-10-CM

## 2021-11-13 MED ORDER — OMEPRAZOLE 20 MG CAPSULE,DELAYED RELEASE
ORAL_CAPSULE | Freq: Every day | ORAL | 2 refills | 90.00000 days | Status: CP
Start: 2021-11-13 — End: 2022-07-22

## 2021-11-18 ENCOUNTER — Ambulatory Visit: Admit: 2021-11-18

## 2021-11-18 ENCOUNTER — Ambulatory Visit: Admit: 2021-11-18 | Attending: Family | Primary: Family

## 2021-11-29 ENCOUNTER — Ambulatory Visit: Admit: 2021-11-29 | Discharge: 2021-11-29 | Disposition: A | Discharge status: Home / Self Care | Payer: MEDICAID

## 2021-11-30 ENCOUNTER — Ambulatory Visit: Admit: 2021-11-30

## 2021-12-30 DIAGNOSIS — E782 Mixed hyperlipidemia: Principal | ICD-10-CM

## 2021-12-30 MED ORDER — PRAVASTATIN 20 MG TABLET
ORAL_TABLET | Freq: Every day | ORAL | 0 refills | 30 days | Status: CP
Start: 2021-12-30 — End: ?

## 2022-01-04 DIAGNOSIS — E782 Mixed hyperlipidemia: Principal | ICD-10-CM

## 2022-01-04 MED ORDER — PRAVASTATIN 20 MG TABLET
ORAL_TABLET | Freq: Every day | ORAL | 3 refills | 90 days | Status: CP
Start: 2022-01-04 — End: ?

## 2022-01-08 ENCOUNTER — Ambulatory Visit: Admit: 2022-01-08 | Attending: Family | Primary: Family

## 2022-01-12 ENCOUNTER — Ambulatory Visit
Admit: 2022-01-12 | Discharge: 2022-01-13 | Disposition: A | Discharge status: Home / Self Care | Payer: MEDICAID | Attending: Emergency Medicine

## 2022-01-12 DIAGNOSIS — R197 Diarrhea, unspecified: Principal | ICD-10-CM

## 2022-01-12 DIAGNOSIS — R112 Nausea with vomiting, unspecified: Principal | ICD-10-CM

## 2022-01-12 MED ORDER — ONDANSETRON HCL 4 MG TABLET
ORAL_TABLET | Freq: Four times a day (QID) | ORAL | 0 refills | 3 days | Status: CP
Start: 2022-01-12 — End: 2022-01-15

## 2022-01-14 ENCOUNTER — Telehealth: Admit: 2022-01-14 | Discharge: 2022-01-15

## 2022-01-14 DIAGNOSIS — F17200 Nicotine dependence, unspecified, uncomplicated: Principal | ICD-10-CM

## 2022-01-14 DIAGNOSIS — F3341 Major depressive disorder, recurrent, in partial remission: Principal | ICD-10-CM

## 2022-01-14 DIAGNOSIS — B20 Human immunodeficiency virus [HIV] disease: Principal | ICD-10-CM

## 2022-01-14 DIAGNOSIS — J302 Other seasonal allergic rhinitis: Principal | ICD-10-CM

## 2022-01-26 MED ORDER — ONDANSETRON HCL 4 MG TABLET
ORAL_TABLET | Freq: Four times a day (QID) | ORAL | 0 refills | 3 days | Status: CP
Start: 2022-01-26 — End: 2022-01-29

## 2022-01-26 MED ORDER — FLUTICASONE PROPIONATE 50 MCG/ACTUATION NASAL SPRAY,SUSPENSION
Freq: Every day | NASAL | 3 refills | 123 days | Status: CP
Start: 2022-01-26 — End: 2023-01-26

## 2022-01-30 DIAGNOSIS — M2550 Pain in unspecified joint: Principal | ICD-10-CM

## 2022-02-01 MED ORDER — TIZANIDINE 2 MG TABLET
ORAL_TABLET | Freq: Three times a day (TID) | ORAL | 3 refills | 10 days | Status: CP | PRN
Start: 2022-02-01 — End: ?

## 2022-02-10 DIAGNOSIS — Z21 Asymptomatic human immunodeficiency virus [HIV] infection status: Principal | ICD-10-CM

## 2022-02-10 MED ORDER — BIKTARVY 50 MG-200 MG-25 MG TABLET
ORAL_TABLET | Freq: Every day | ORAL | 0 refills | 30 days | Status: CP
Start: 2022-02-10 — End: ?

## 2022-02-12 DIAGNOSIS — M2550 Pain in unspecified joint: Principal | ICD-10-CM

## 2022-02-12 MED ORDER — PREGABALIN 75 MG CAPSULE
ORAL_CAPSULE | Freq: Two times a day (BID) | ORAL | 3 refills | 90 days | Status: CP
Start: 2022-02-12 — End: 2023-01-14

## 2022-02-21 DIAGNOSIS — K219 Gastro-esophageal reflux disease without esophagitis: Principal | ICD-10-CM

## 2022-02-21 DIAGNOSIS — J302 Other seasonal allergic rhinitis: Principal | ICD-10-CM

## 2022-02-21 DIAGNOSIS — F5101 Primary insomnia: Principal | ICD-10-CM

## 2022-02-21 DIAGNOSIS — Z21 Asymptomatic human immunodeficiency virus [HIV] infection status: Principal | ICD-10-CM

## 2022-02-21 DIAGNOSIS — M2550 Pain in unspecified joint: Principal | ICD-10-CM

## 2022-02-22 MED ORDER — OMEPRAZOLE 20 MG CAPSULE,DELAYED RELEASE
ORAL_CAPSULE | Freq: Every day | ORAL | 2 refills | 90 days | Status: CP
Start: 2022-02-22 — End: 2022-10-31

## 2022-02-22 MED ORDER — FLUTICASONE PROPIONATE 50 MCG/ACTUATION NASAL SPRAY,SUSPENSION
Freq: Every day | NASAL | 3 refills | 120 days | Status: CP
Start: 2022-02-22 — End: 2023-02-22

## 2022-02-22 MED ORDER — PREGABALIN 75 MG CAPSULE
ORAL_CAPSULE | Freq: Two times a day (BID) | ORAL | 3 refills | 90 days | Status: CP
Start: 2022-02-22 — End: 2023-01-24

## 2022-02-22 MED ORDER — TIZANIDINE 2 MG TABLET
ORAL_TABLET | Freq: Three times a day (TID) | ORAL | 3 refills | 10 days | Status: CP | PRN
Start: 2022-02-22 — End: ?

## 2022-02-22 MED ORDER — DULOXETINE 60 MG CAPSULE,DELAYED RELEASE
ORAL_CAPSULE | Freq: Every day | ORAL | 3 refills | 90 days | Status: CP
Start: 2022-02-22 — End: 2023-02-17

## 2022-02-23 MED ORDER — METHIMAZOLE 5 MG TABLET
ORAL_TABLET | Freq: Three times a day (TID) | ORAL | 0 refills | 10 days | Status: CP
Start: 2022-02-23 — End: 2023-02-23

## 2022-02-24 DIAGNOSIS — Z21 Asymptomatic human immunodeficiency virus [HIV] infection status: Principal | ICD-10-CM

## 2022-02-24 MED ORDER — BIKTARVY 50 MG-200 MG-25 MG TABLET
ORAL_TABLET | Freq: Every day | ORAL | 0 refills | 30 days | Status: CP
Start: 2022-02-24 — End: ?

## 2022-02-26 ENCOUNTER — Ambulatory Visit: Admit: 2022-02-26 | Discharge: 2022-02-27 | Attending: Family | Primary: Family

## 2022-02-26 DIAGNOSIS — Z21 Asymptomatic human immunodeficiency virus [HIV] infection status: Principal | ICD-10-CM

## 2022-02-26 DIAGNOSIS — M25531 Pain in right wrist: Principal | ICD-10-CM

## 2022-02-26 DIAGNOSIS — B20 Human immunodeficiency virus [HIV] disease: Principal | ICD-10-CM

## 2022-02-26 DIAGNOSIS — E0500 Thyrotoxicosis with diffuse goiter without thyrotoxic crisis or storm: Principal | ICD-10-CM

## 2022-02-26 DIAGNOSIS — Z0000 Encounter for general adult medical examination without abnormal findings: Principal | ICD-10-CM

## 2022-02-26 DIAGNOSIS — Z113 Encounter for screening for infections with a predominantly sexual mode of transmission: Principal | ICD-10-CM

## 2022-02-26 DIAGNOSIS — I10 Essential (primary) hypertension: Principal | ICD-10-CM

## 2022-02-26 DIAGNOSIS — M79642 Pain in left hand: Principal | ICD-10-CM

## 2022-02-26 DIAGNOSIS — M79641 Pain in right hand: Principal | ICD-10-CM

## 2022-02-26 DIAGNOSIS — M25532 Pain in left wrist: Principal | ICD-10-CM

## 2022-02-26 MED ORDER — TRAZODONE 50 MG TABLET
ORAL_TABLET | Freq: Every evening | ORAL | 11 refills | 30 days | Status: CP
Start: 2022-02-26 — End: 2022-03-28

## 2022-02-26 MED ORDER — BIKTARVY 50 MG-200 MG-25 MG TABLET
ORAL_TABLET | Freq: Every day | ORAL | 11 refills | 30.00000 days | Status: CP
Start: 2022-02-26 — End: 2022-02-26

## 2022-02-26 MED ORDER — AMLODIPINE 10 MG TABLET
ORAL_TABLET | Freq: Every day | ORAL | 3 refills | 90.00000 days | Status: CP
Start: 2022-02-26 — End: 2022-02-26

## 2022-02-26 MED ORDER — GABAPENTIN 600 MG TABLET
ORAL_TABLET | Freq: Every day | ORAL | 0 refills | 30 days | Status: CP | PRN
Start: 2022-02-26 — End: 2022-03-28

## 2022-03-01 DIAGNOSIS — I10 Essential (primary) hypertension: Principal | ICD-10-CM

## 2022-03-01 DIAGNOSIS — M79641 Pain in right hand: Principal | ICD-10-CM

## 2022-03-01 DIAGNOSIS — B20 Human immunodeficiency virus [HIV] disease: Principal | ICD-10-CM

## 2022-03-01 DIAGNOSIS — M79642 Pain in left hand: Principal | ICD-10-CM

## 2022-03-01 DIAGNOSIS — M25531 Pain in right wrist: Principal | ICD-10-CM

## 2022-03-01 DIAGNOSIS — M25532 Pain in left wrist: Principal | ICD-10-CM

## 2022-03-02 MED ORDER — AMLODIPINE 10 MG TABLET
ORAL_TABLET | Freq: Every day | ORAL | 2 refills | 90 days | Status: CP
Start: 2022-03-02 — End: 2022-04-01

## 2022-03-03 DIAGNOSIS — B20 Human immunodeficiency virus [HIV] disease: Principal | ICD-10-CM

## 2022-03-03 MED ORDER — CABOTEGRAVIR ER 600 MG/3 ML-RILPIVIRINE ER 900 MG/3ML IM SUSPENSION,ER
INTRAMUSCULAR | 0 refills | 60.00000 days | Status: CP
Start: 2022-03-03 — End: 2022-04-03

## 2022-03-09 DIAGNOSIS — B20 Human immunodeficiency virus [HIV] disease: Principal | ICD-10-CM

## 2022-03-19 ENCOUNTER — Ambulatory Visit: Admit: 2022-03-19

## 2022-04-06 ENCOUNTER — Ambulatory Visit: Admit: 2022-04-06

## 2022-04-07 ENCOUNTER — Ambulatory Visit: Admit: 2022-04-07 | Discharge: 2022-04-08 | Disposition: A | Discharge status: Home / Self Care | Payer: MEDICAID

## 2022-04-14 DIAGNOSIS — M79642 Pain in left hand: Principal | ICD-10-CM

## 2022-04-14 DIAGNOSIS — M25531 Pain in right wrist: Principal | ICD-10-CM

## 2022-04-14 DIAGNOSIS — M79641 Pain in right hand: Principal | ICD-10-CM

## 2022-04-14 DIAGNOSIS — B20 Human immunodeficiency virus [HIV] disease: Principal | ICD-10-CM

## 2022-04-14 DIAGNOSIS — M25532 Pain in left wrist: Principal | ICD-10-CM

## 2022-04-14 MED ORDER — GABAPENTIN 600 MG TABLET
ORAL_TABLET | Freq: Every day | ORAL | 0 refills | 30 days | Status: CP | PRN
Start: 2022-04-14 — End: 2022-05-14

## 2022-04-15 DIAGNOSIS — B20 Human immunodeficiency virus [HIV] disease: Principal | ICD-10-CM

## 2022-04-15 DIAGNOSIS — M25531 Pain in right wrist: Principal | ICD-10-CM

## 2022-04-15 DIAGNOSIS — M79642 Pain in left hand: Principal | ICD-10-CM

## 2022-04-15 DIAGNOSIS — M79641 Pain in right hand: Principal | ICD-10-CM

## 2022-04-15 DIAGNOSIS — M25532 Pain in left wrist: Principal | ICD-10-CM

## 2022-04-15 MED ORDER — GABAPENTIN 600 MG TABLET
ORAL_TABLET | Freq: Every day | ORAL | 0 refills | 30 days | Status: CP | PRN
Start: 2022-04-15 — End: 2022-05-15

## 2022-04-23 ENCOUNTER — Ambulatory Visit: Admit: 2022-04-23 | Attending: Family | Primary: Family

## 2022-05-05 DIAGNOSIS — F5101 Primary insomnia: Principal | ICD-10-CM

## 2022-05-05 MED ORDER — HYDROXYZINE PAMOATE 25 MG CAPSULE
ORAL_CAPSULE | Freq: Every day | ORAL | 0 refills | 60 days | Status: CP | PRN
Start: 2022-05-05 — End: 2022-07-22

## 2022-05-16 DIAGNOSIS — B20 Human immunodeficiency virus [HIV] disease: Principal | ICD-10-CM

## 2022-05-17 DIAGNOSIS — M25531 Pain in right wrist: Principal | ICD-10-CM

## 2022-05-17 DIAGNOSIS — M79642 Pain in left hand: Principal | ICD-10-CM

## 2022-05-17 DIAGNOSIS — M25532 Pain in left wrist: Principal | ICD-10-CM

## 2022-05-17 DIAGNOSIS — B20 Human immunodeficiency virus [HIV] disease: Principal | ICD-10-CM

## 2022-05-17 DIAGNOSIS — M79641 Pain in right hand: Principal | ICD-10-CM

## 2022-06-02 DIAGNOSIS — B20 Human immunodeficiency virus [HIV] disease: Principal | ICD-10-CM

## 2022-06-18 ENCOUNTER — Ambulatory Visit: Admit: 2022-06-18 | Attending: Family | Primary: Family

## 2022-07-30 ENCOUNTER — Ambulatory Visit: Admit: 2022-07-30 | Discharge: 2022-07-30 | Payer: PRIVATE HEALTH INSURANCE

## 2022-07-30 ENCOUNTER — Ambulatory Visit: Admit: 2022-07-30 | Discharge: 2022-07-30 | Payer: PRIVATE HEALTH INSURANCE | Attending: Family | Primary: Family

## 2022-07-30 DIAGNOSIS — B20 Human immunodeficiency virus [HIV] disease: Principal | ICD-10-CM

## 2022-07-30 DIAGNOSIS — I10 Essential (primary) hypertension: Principal | ICD-10-CM

## 2022-07-30 DIAGNOSIS — Z113 Encounter for screening for infections with a predominantly sexual mode of transmission: Principal | ICD-10-CM

## 2022-07-30 DIAGNOSIS — Z5181 Encounter for therapeutic drug level monitoring: Principal | ICD-10-CM

## 2022-07-30 DIAGNOSIS — Z23 Encounter for immunization: Principal | ICD-10-CM

## 2022-07-30 DIAGNOSIS — M7552 Bursitis of left shoulder: Principal | ICD-10-CM

## 2022-07-30 DIAGNOSIS — N898 Other specified noninflammatory disorders of vagina: Principal | ICD-10-CM

## 2022-07-30 DIAGNOSIS — Z9189 Other specified personal risk factors, not elsewhere classified: Principal | ICD-10-CM

## 2022-07-30 DIAGNOSIS — Z8742 Personal history of other diseases of the female genital tract: Principal | ICD-10-CM

## 2022-07-30 DIAGNOSIS — Z79899 Other long term (current) drug therapy: Principal | ICD-10-CM

## 2022-07-30 DIAGNOSIS — M25512 Pain in left shoulder: Principal | ICD-10-CM

## 2022-07-30 MED ORDER — BICTEGRAVIR 50 MG-EMTRICITABINE 200 MG-TENOFOVIR ALAFENAM 25 MG TABLET
ORAL_TABLET | Freq: Every day | ORAL | 11 refills | 30 days | Status: CP
Start: 2022-07-30 — End: ?

## 2022-07-30 MED ORDER — LIDOCAINE 4 % TOPICAL PATCH
MEDICATED_PATCH | Freq: Every day | TRANSDERMAL | 1 refills | 15 days | Status: CP
Start: 2022-07-30 — End: ?

## 2022-07-30 MED ORDER — LOSARTAN 50 MG TABLET
ORAL_TABLET | Freq: Every day | ORAL | 3 refills | 90 days | Status: CP
Start: 2022-07-30 — End: 2023-07-30

## 2022-07-30 MED ORDER — AMLODIPINE 10 MG TABLET
ORAL_TABLET | Freq: Every day | ORAL | 3 refills | 90 days | Status: CP
Start: 2022-07-30 — End: ?

## 2022-08-03 MED ORDER — OLMESARTAN 20 MG TABLET
ORAL_TABLET | Freq: Every day | ORAL | 3 refills | 30 days | Status: CP
Start: 2022-08-03 — End: 2023-08-03

## 2022-08-07 ENCOUNTER — Ambulatory Visit (INDEPENDENT_AMBULATORY_CARE_PROVIDER_SITE_OTHER): Payer: 59

## 2022-08-07 ENCOUNTER — Ambulatory Visit
Admission: EM | Admit: 2022-08-07 | Discharge: 2022-08-07 | Disposition: A | Discharge status: Home / Self Care | Payer: 59 | Attending: Physician Assistant | Admitting: Physician Assistant

## 2022-08-07 ENCOUNTER — Encounter: Payer: Self-pay | Admitting: Emergency Medicine

## 2022-08-07 DIAGNOSIS — J208 Acute bronchitis due to other specified organisms: Secondary | ICD-10-CM | POA: Insufficient documentation

## 2022-08-07 DIAGNOSIS — R059 Cough, unspecified: Secondary | ICD-10-CM | POA: Diagnosis not present

## 2022-08-07 DIAGNOSIS — R519 Headache, unspecified: Secondary | ICD-10-CM | POA: Diagnosis not present

## 2022-08-07 DIAGNOSIS — Z79899 Other long term (current) drug therapy: Secondary | ICD-10-CM | POA: Insufficient documentation

## 2022-08-07 DIAGNOSIS — F17200 Nicotine dependence, unspecified, uncomplicated: Secondary | ICD-10-CM | POA: Diagnosis not present

## 2022-08-07 DIAGNOSIS — B9689 Other specified bacterial agents as the cause of diseases classified elsewhere: Secondary | ICD-10-CM | POA: Insufficient documentation

## 2022-08-07 DIAGNOSIS — Z7952 Long term (current) use of systemic steroids: Secondary | ICD-10-CM | POA: Diagnosis not present

## 2022-08-07 DIAGNOSIS — Z20822 Contact with and (suspected) exposure to covid-19: Secondary | ICD-10-CM | POA: Insufficient documentation

## 2022-08-07 DIAGNOSIS — R509 Fever, unspecified: Secondary | ICD-10-CM | POA: Diagnosis not present

## 2022-08-07 DIAGNOSIS — R69 Illness, unspecified: Secondary | ICD-10-CM | POA: Diagnosis not present

## 2022-08-07 LAB — SARS CORONAVIRUS 2 BY RT PCR: SARS Coronavirus 2 by RT PCR: NEGATIVE

## 2022-08-07 MED ORDER — AZITHROMYCIN 250 MG PO TABS: ORAL_TABLET | ORAL | 0 refills | Status: AC

## 2022-08-07 MED ORDER — PREDNISONE 50 MG PO TABS: 50.0000 mg | ORAL_TABLET | Freq: Every day | ORAL | 0 refills | Status: AC

## 2022-08-07 NOTE — Discharge Instructions (Signed)
You were seen for cough and wheezing and are being treated for bacterial bronchitis.   -COVID test returned as negative. -Take the antibiotics as prescribed until they're finished. If you think you're having a reaction, stop the medication, take benadryl and go to the nearest urgent care/emergency room. Take a probiotic while taking the antibiotic to decrease the chances of stomach upset.  -You are being prescribed a steroid.  Take as directed. -Use your albuterol inhaler every 6 hours for 2 days then as needed. -Follow-up if your symptoms worsen or do not improve after treatment. -Decrease your smoking. -Take your blood pressure medication as soon as you get home.  Take care, Dr. Marland Kitchen, NP-c

## 2022-08-07 NOTE — ED Triage Notes (Signed)
Patient c/o cough, congestion, runny nose, and headache that started on Tuesday.  Patient unsure of fevers.

## 2022-08-07 NOTE — ED Provider Notes (Signed)
[facility] [place] [facility] [place]   Name: Taylor Mahoney DOB: 03-18-1966 MRN: [id number] CSN: [id number] [facility]  Arrival date and time:  08/07/22 1205  Chief Complaint:  Cough   NOTE: Prior to seeing the patient today, I have reviewed the triage nursing documentation and vital signs. Clinical staff has updated patient's PMH/PSHx, current medication list, and drug allergies/intolerances to ensure comprehensive history available to assist in medical decision making.   History:   HPI: Taylor Mahoney is a 56 y.o. female who presents today with complaints of chest congestion x4 days.  Patient has noticed increased shortness of breath and wheezing over the past 48 hours.  She has an albuterol inhaler for asthma diagnosis but she has not noticed little to no change with its use.  She has tried over-the-counter cough medicine to no improvement.  She is an active smoker, but she has not smoked while she is feeling ill.  No recent antibiotics.  She has had 1 known sick contact: Her son was sick with similar issues approximately 1 week ago, but his symptoms recovered.  He was not evaluated during his illness. He has known high blood pressure, but she did not take her blood pressure medication today.   Past Medical History:  Diagnosis Date   Hypertension     Past Surgical History:  Procedure Laterality Date   DILATION AND CURETTAGE OF UTERUS     EYE SURGERY      History reviewed. No pertinent family history.  Social History   Tobacco Use   Smoking status: Every Day    Types: Cigarettes   Smokeless tobacco: Never  Vaping Use   Vaping Use: Never used  Substance Use Topics   Alcohol use: Yes   Drug use: Never    There are no problems to display for this patient.   Home Medications:    Current Meds  Medication Sig   amLODipine (NORVASC) 10 MG tablet Take 1 tablet by mouth daily.   B Complex-C (B-COMPLEX WITH VITAMIN C) tablet Take 1 tablet by mouth daily.    tiZANidine (ZANAFLEX) 2 MG tablet Take by mouth.    Allergies:   Codeine and Tape  Review of Systems (ROS): Review of Systems  Constitutional:  Positive for activity change, appetite change and fatigue. Negative for chills and diaphoresis.  HENT:  Positive for postnasal drip and sore throat. Negative for congestion, sinus pain and sneezing.   Respiratory:  Positive for cough, shortness of breath and wheezing.   Cardiovascular:  Negative for chest pain.  Gastrointestinal:  Negative for diarrhea, nausea and vomiting.  All other systems reviewed and are negative.    Vital Signs: Today's Vitals   08/07/22 1248 08/07/22 1252  BP:  (!) 172/83  Pulse:  60  Resp:  14  Temp:  97.9 F (36.6 C)  TempSrc:  Oral  SpO2:  97%  Weight: 158 lb 15.2 oz (72.1 kg)   Height: 5\' 4"  (1.626 m)   PainSc: 7      Physical Exam: Physical Exam Vitals and nursing note reviewed.  Constitutional:      Appearance: Normal appearance.  HENT:     Right Ear: Tympanic membrane normal.     Left Ear: Tympanic membrane normal.     Nose: Nose normal.     Right Sinus: No maxillary sinus tenderness or frontal sinus tenderness.     Left Sinus: No maxillary sinus tenderness or frontal sinus tenderness.     Mouth/Throat:  Mouth: Mucous membranes are moist.     Pharynx: Posterior oropharyngeal erythema present.  Eyes:     General: Lids are normal.     Conjunctiva/sclera: Conjunctivae normal.     Pupils: Pupils are equal, round, and reactive to light.  Cardiovascular:     Rate and Rhythm: Normal rate and regular rhythm.     Pulses: Normal pulses.     Heart sounds: Normal heart sounds.  Pulmonary:     Effort: Pulmonary effort is normal. No accessory muscle usage or respiratory distress.     Breath sounds: Decreased air movement present. Decreased breath sounds, wheezing and rhonchi present.  Skin:    General: Skin is warm and dry.     Capillary Refill: Capillary refill takes less than 2 seconds.   Neurological:     Mental Status: She is alert.      Urgent Care Treatments / Results:   LABS: PLEASE NOTE: all labs that were ordered this encounter are listed, however only abnormal results are displayed. Labs Reviewed  SARS CORONAVIRUS 2 BY RT PCR    EKG: -None  RADIOLOGY: DG Chest 2 View  Result Date: 08/07/2022 CLINICAL DATA:  Patient c/o cough, congestion, runny nose, and headache that started on Tuesday. Patient unsure of fevers. Hx HTN. Current smoker EXAM: CHEST - 2 VIEW COMPARISON:  06/04/2021. FINDINGS: Cardiac silhouette is normal in size and configuration. Normal mediastinal and hilar contours. Lungs are clear.  No pleural effusion or pneumothorax. Skeletal structures are unremarkable. IMPRESSION: No active cardiopulmonary disease. Electronically Signed   By: Amie Portland M.D.   On: 08/07/2022 13:31    PROCEDURES: Procedures  MEDICATIONS RECEIVED THIS VISIT: Medications - No data to display  PERTINENT CLINICAL COURSE NOTES/UPDATES:   Initial Impression / Assessment and Plan / Urgent Care Course:  Pertinent labs & imaging results that were available during my care of the patient were personally reviewed by me and considered in my medical decision making (see lab/imaging section of note for values and interpretations).  Taylor Mahoney is a 57 y.o. female who presents to [facility] today with complaints of shortness of breath, diagnosed with bacterial bronchitis, and treated as such with the medications below. NP and patient reviewed discharge instructions below during visit.   Patient is well appearing overall in clinic today. She does not appear to be in any acute distress. Presenting symptoms (see HPI) and exam as documented above.   I have reviewed the follow up and strict return precautions for any new or worsening symptoms. Patient is aware of symptoms that would be deemed urgent/emergent, and would thus require further evaluation either here or in the  emergency department. At the time of discharge, she verbalized understanding and consent with the discharge plan as it was reviewed with her. All questions were fielded by provider and/or clinic staff prior to patient discharge.    Final Clinical Impressions / Urgent Care Diagnoses:   Final diagnoses:  Acute bacterial bronchitis    New Prescriptions:  [place] Controlled Substance Registry consulted? Not Applicable  Meds ordered this encounter  Medications   predniSONE (DELTASONE) 50 MG tablet    Sig: Take 1 tablet (50 mg total) by mouth daily with breakfast.    Dispense:  5 tablet    Refill:  0   azithromycin (ZITHROMAX Z-PAK) 250 MG tablet    Sig: Take 2 tablets (500 mg total) by mouth daily for 1 day, THEN 1 tablet (250 mg total) daily for 4 days.  Dispense:  6 tablet    Refill:  0      Discharge Instructions      You were seen for cough and wheezing and are being treated for bacterial bronchitis.   -COVID test returned as negative. -Take the antibiotics as prescribed until they're finished. If you think you're having a reaction, stop the medication, take benadryl and go to the nearest urgent care/emergency room. Take a probiotic while taking the antibiotic to decrease the chances of stomach upset.  -You are being prescribed a steroid.  Take as directed. -Use your albuterol inhaler every 6 hours for 2 days then as needed. -Follow-up if your symptoms worsen or do not improve after treatment. -Decrease your smoking. -Take your blood pressure medication as soon as you get home.  Take care, Dr. Marland Kitchen, NP-c     Recommended Follow up Care:  Patient encouraged to follow up with the following provider within the specified time frame, or sooner as dictated by the severity of her symptoms. As always, she was instructed that for any urgent/emergent care needs, she should seek care either here or in the emergency department for more immediate evaluation.   Gertie Baron, DNP,  NP-c   Gertie Baron, NP 08/07/22 1406

## 2022-08-27 DIAGNOSIS — J302 Other seasonal allergic rhinitis: Principal | ICD-10-CM

## 2022-08-30 DIAGNOSIS — J302 Other seasonal allergic rhinitis: Principal | ICD-10-CM

## 2022-08-30 MED ORDER — FLUTICASONE PROPIONATE 50 MCG/ACTUATION NASAL SPRAY,SUSPENSION
Freq: Every day | NASAL | 3 refills | 120 days | Status: CP
Start: 2022-08-30 — End: 2023-08-30

## 2022-09-13 DIAGNOSIS — B20 Human immunodeficiency virus [HIV] disease: Principal | ICD-10-CM

## 2022-09-13 DIAGNOSIS — Z79899 Other long term (current) drug therapy: Principal | ICD-10-CM

## 2022-09-13 MED ORDER — BICTEGRAVIR 50 MG-EMTRICITABINE 200 MG-TENOFOVIR ALAFENAM 25 MG TABLET
ORAL_TABLET | Freq: Every day | ORAL | 11 refills | 30 days | Status: CP
Start: 2022-09-13 — End: ?

## 2022-09-22 MED ORDER — OMEPRAZOLE 20 MG CAPSULE,DELAYED RELEASE
ORAL_CAPSULE | Freq: Every day | ORAL | 2 refills | 90 days | Status: CP
Start: 2022-09-22 — End: 2023-05-31

## 2022-09-22 MED ORDER — ALBUTEROL SULFATE HFA 90 MCG/ACTUATION AEROSOL INHALER
RESPIRATORY_TRACT | 2 refills | 0 days | Status: CP | PRN
Start: 2022-09-22 — End: 2023-09-22

## 2022-09-22 MED ORDER — TRAZODONE 50 MG TABLET
ORAL_TABLET | Freq: Every evening | ORAL | 4 refills | 30 days | Status: CP
Start: 2022-09-22 — End: ?

## 2022-09-22 MED ORDER — DULOXETINE 60 MG CAPSULE,DELAYED RELEASE
ORAL_CAPSULE | Freq: Every day | ORAL | 0 refills | 90 days | Status: CP
Start: 2022-09-22 — End: 2023-09-17

## 2022-09-22 MED ORDER — OLMESARTAN 20 MG TABLET
ORAL_TABLET | Freq: Every day | ORAL | 3 refills | 30 days | Status: CP
Start: 2022-09-22 — End: 2023-09-22

## 2022-09-22 MED ORDER — AMLODIPINE 10 MG TABLET
ORAL_TABLET | Freq: Every day | ORAL | 0 refills | 90 days | Status: CP
Start: 2022-09-22 — End: ?

## 2022-09-22 MED ORDER — LIDOCAINE 4 % TOPICAL PATCH
MEDICATED_PATCH | Freq: Every day | TRANSDERMAL | 1 refills | 15 days | Status: CP
Start: 2022-09-22 — End: ?

## 2022-09-22 MED ORDER — GABAPENTIN 600 MG TABLET
ORAL_TABLET | Freq: Every day | ORAL | 1 refills | 30 days | Status: CP | PRN
Start: 2022-09-22 — End: ?

## 2022-09-22 MED ORDER — PRAVASTATIN 20 MG TABLET
ORAL_TABLET | Freq: Every day | ORAL | 0 refills | 90 days | Status: CP
Start: 2022-09-22 — End: ?

## 2022-09-24 MED ORDER — PREGABALIN 75 MG CAPSULE
ORAL_CAPSULE | Freq: Two times a day (BID) | ORAL | 0 refills | 30 days | Status: CP
Start: 2022-09-24 — End: 2022-10-24

## 2022-09-28 ENCOUNTER — Ambulatory Visit: Admit: 2022-09-28 | Discharge: 2022-09-29 | Payer: PRIVATE HEALTH INSURANCE

## 2022-09-28 DIAGNOSIS — B20 Human immunodeficiency virus [HIV] disease: Principal | ICD-10-CM

## 2022-09-28 DIAGNOSIS — E0590 Thyrotoxicosis, unspecified without thyrotoxic crisis or storm: Principal | ICD-10-CM

## 2022-09-28 DIAGNOSIS — M25531 Pain in right wrist: Principal | ICD-10-CM

## 2022-09-28 DIAGNOSIS — M79641 Pain in right hand: Principal | ICD-10-CM

## 2022-09-28 DIAGNOSIS — I10 Essential (primary) hypertension: Principal | ICD-10-CM

## 2022-09-28 DIAGNOSIS — J441 Chronic obstructive pulmonary disease with (acute) exacerbation: Principal | ICD-10-CM

## 2022-09-28 DIAGNOSIS — F17200 Nicotine dependence, unspecified, uncomplicated: Principal | ICD-10-CM

## 2022-09-28 DIAGNOSIS — M79642 Pain in left hand: Principal | ICD-10-CM

## 2022-09-28 DIAGNOSIS — F109 Alcohol use disorder: Principal | ICD-10-CM

## 2022-09-28 DIAGNOSIS — E782 Mixed hyperlipidemia: Principal | ICD-10-CM

## 2022-09-28 DIAGNOSIS — M2550 Pain in unspecified joint: Principal | ICD-10-CM

## 2022-09-28 DIAGNOSIS — Z79899 Other long term (current) drug therapy: Principal | ICD-10-CM

## 2022-09-28 DIAGNOSIS — Z0000 Encounter for general adult medical examination without abnormal findings: Principal | ICD-10-CM

## 2022-09-28 DIAGNOSIS — M25532 Pain in left wrist: Principal | ICD-10-CM

## 2022-09-28 DIAGNOSIS — K219 Gastro-esophageal reflux disease without esophagitis: Principal | ICD-10-CM

## 2022-09-28 DIAGNOSIS — F3341 Major depressive disorder, recurrent, in partial remission: Principal | ICD-10-CM

## 2022-09-28 DIAGNOSIS — J4541 Moderate persistent asthma with (acute) exacerbation: Principal | ICD-10-CM

## 2022-09-28 DIAGNOSIS — F5101 Primary insomnia: Principal | ICD-10-CM

## 2022-09-28 DIAGNOSIS — F1411 Cocaine abuse, in remission: Principal | ICD-10-CM

## 2022-09-28 MED ORDER — PRAVASTATIN 20 MG TABLET
ORAL_TABLET | Freq: Every day | ORAL | 3 refills | 90 days | Status: CP
Start: 2022-09-28 — End: ?

## 2022-09-28 MED ORDER — OMEPRAZOLE 20 MG CAPSULE,DELAYED RELEASE
ORAL_CAPSULE | Freq: Every day | ORAL | 3 refills | 90 days | Status: CP
Start: 2022-09-28 — End: 2023-09-23

## 2022-09-28 MED ORDER — AZITHROMYCIN 250 MG TABLET
ORAL_TABLET | 0 refills | 7 days | Status: CP
Start: 2022-09-28 — End: 2022-10-05

## 2022-09-28 MED ORDER — DULOXETINE 60 MG CAPSULE,DELAYED RELEASE
ORAL_CAPSULE | Freq: Every day | ORAL | 3 refills | 90 days | Status: CP
Start: 2022-09-28 — End: 2023-09-23

## 2022-09-28 MED ORDER — TRAZODONE 50 MG TABLET
ORAL_TABLET | Freq: Every evening | ORAL | 3 refills | 90 days | Status: CP
Start: 2022-09-28 — End: ?

## 2022-09-28 MED ORDER — FLUTICASONE 113 MCG-SALMETEROL 14 MCG/ACTUATION BREATH ACTIVATED POWDR
Freq: Two times a day (BID) | RESPIRATORY_TRACT | 3 refills | 0 days | Status: CP
Start: 2022-09-28 — End: ?

## 2022-09-28 MED ORDER — METHIMAZOLE 5 MG TABLET
ORAL_TABLET | Freq: Three times a day (TID) | ORAL | 3 refills | 30 days | Status: CP
Start: 2022-09-28 — End: 2023-01-26

## 2022-09-28 MED ORDER — OLMESARTAN 20 MG TABLET
ORAL_TABLET | Freq: Every day | ORAL | 3 refills | 90 days | Status: CP
Start: 2022-09-28 — End: 2023-09-28

## 2022-09-28 MED ORDER — AMLODIPINE 10 MG TABLET
ORAL_TABLET | Freq: Every day | ORAL | 3 refills | 90 days | Status: CP
Start: 2022-09-28 — End: ?

## 2022-09-28 MED ORDER — PREDNISONE 10 MG TABLET
ORAL_TABLET | Freq: Every day | ORAL | 0 refills | 5 days | Status: CP
Start: 2022-09-28 — End: ?

## 2022-09-28 MED ORDER — BICTEGRAVIR 50 MG-EMTRICITABINE 200 MG-TENOFOVIR ALAFENAM 25 MG TABLET
ORAL_TABLET | Freq: Every day | ORAL | 11 refills | 30 days | Status: CP
Start: 2022-09-28 — End: 2022-09-28

## 2022-09-28 MED ORDER — PREGABALIN 75 MG CAPSULE
ORAL_CAPSULE | Freq: Two times a day (BID) | ORAL | 3 refills | 90 days | Status: CP
Start: 2022-09-28 — End: 2023-09-23

## 2022-09-30 DIAGNOSIS — K219 Gastro-esophageal reflux disease without esophagitis: Principal | ICD-10-CM

## 2022-09-30 MED ORDER — PANTOPRAZOLE 20 MG TABLET,DELAYED RELEASE
ORAL_TABLET | Freq: Two times a day (BID) | ORAL | 2 refills | 30 days | Status: CP
Start: 2022-09-30 — End: 2022-12-29

## 2022-10-27 ENCOUNTER — Ambulatory Visit: Admit: 2022-10-27 | Discharge: 2022-10-28

## 2022-10-27 DIAGNOSIS — K219 Gastro-esophageal reflux disease without esophagitis: Principal | ICD-10-CM

## 2022-10-27 DIAGNOSIS — M5431 Sciatica, right side: Principal | ICD-10-CM

## 2022-10-27 DIAGNOSIS — R002 Palpitations: Principal | ICD-10-CM

## 2022-10-27 DIAGNOSIS — M25512 Pain in left shoulder: Principal | ICD-10-CM

## 2022-10-27 DIAGNOSIS — M5441 Lumbago with sciatica, right side: Principal | ICD-10-CM

## 2022-10-27 DIAGNOSIS — M5442 Lumbago with sciatica, left side: Principal | ICD-10-CM

## 2022-10-27 DIAGNOSIS — G8929 Other chronic pain: Principal | ICD-10-CM

## 2022-10-27 DIAGNOSIS — F3341 Major depressive disorder, recurrent, in partial remission: Principal | ICD-10-CM

## 2022-10-27 DIAGNOSIS — I10 Essential (primary) hypertension: Principal | ICD-10-CM

## 2022-10-27 DIAGNOSIS — F17200 Nicotine dependence, unspecified, uncomplicated: Principal | ICD-10-CM

## 2022-10-27 DIAGNOSIS — E0590 Thyrotoxicosis, unspecified without thyrotoxic crisis or storm: Principal | ICD-10-CM

## 2022-10-27 MED ORDER — OMEPRAZOLE 20 MG CAPSULE,DELAYED RELEASE
ORAL_CAPSULE | Freq: Every day | ORAL | 3 refills | 90 days | Status: CP
Start: 2022-10-27 — End: 2023-10-27

## 2022-11-23 ENCOUNTER — Ambulatory Visit: Admit: 2022-11-23 | Payer: MEDICAID

## 2022-12-08 ENCOUNTER — Ambulatory Visit: Admit: 2022-12-08 | Discharge: 2022-12-09 | Payer: MEDICAID

## 2022-12-08 DIAGNOSIS — F17200 Nicotine dependence, unspecified, uncomplicated: Principal | ICD-10-CM

## 2022-12-08 DIAGNOSIS — E0590 Thyrotoxicosis, unspecified without thyrotoxic crisis or storm: Principal | ICD-10-CM

## 2022-12-08 DIAGNOSIS — M5442 Lumbago with sciatica, left side: Principal | ICD-10-CM

## 2022-12-08 DIAGNOSIS — G894 Chronic pain syndrome: Principal | ICD-10-CM

## 2022-12-08 DIAGNOSIS — B20 Human immunodeficiency virus [HIV] disease: Principal | ICD-10-CM

## 2022-12-08 DIAGNOSIS — K219 Gastro-esophageal reflux disease without esophagitis: Principal | ICD-10-CM

## 2022-12-08 DIAGNOSIS — I10 Essential (primary) hypertension: Principal | ICD-10-CM

## 2022-12-08 DIAGNOSIS — G8929 Other chronic pain: Principal | ICD-10-CM

## 2022-12-08 DIAGNOSIS — M5441 Lumbago with sciatica, right side: Principal | ICD-10-CM

## 2022-12-08 DIAGNOSIS — M797 Fibromyalgia: Principal | ICD-10-CM

## 2022-12-08 DIAGNOSIS — F3341 Major depressive disorder, recurrent, in partial remission: Principal | ICD-10-CM

## 2022-12-08 MED ORDER — VARENICLINE 1 MG TABLET
ORAL_TABLET | Freq: Two times a day (BID) | ORAL | 2 refills | 30 days | Status: CP
Start: 2022-12-08 — End: 2023-03-08

## 2022-12-08 MED ORDER — LIDOCAINE 4 % TOPICAL PATCH
MEDICATED_PATCH | Freq: Every day | TRANSDERMAL | 3 refills | 30 days | Status: CP
Start: 2022-12-08 — End: ?

## 2022-12-08 MED ORDER — CELECOXIB 100 MG CAPSULE
ORAL_CAPSULE | Freq: Two times a day (BID) | ORAL | 2 refills | 30 days | Status: CP
Start: 2022-12-08 — End: 2023-12-08

## 2022-12-08 MED ORDER — OMEPRAZOLE 20 MG CAPSULE,DELAYED RELEASE
ORAL_CAPSULE | Freq: Every day | ORAL | 3 refills | 90 days | Status: CP
Start: 2022-12-08 — End: 2023-12-08

## 2022-12-23 ENCOUNTER — Ambulatory Visit: Admit: 2022-12-23 | Discharge: 2022-12-24 | Payer: MEDICAID

## 2022-12-27 DIAGNOSIS — G8929 Other chronic pain: Principal | ICD-10-CM

## 2022-12-27 DIAGNOSIS — M5441 Lumbago with sciatica, right side: Principal | ICD-10-CM

## 2022-12-27 DIAGNOSIS — M5442 Lumbago with sciatica, left side: Principal | ICD-10-CM

## 2022-12-31 ENCOUNTER — Ambulatory Visit: Admit: 2022-12-31 | Discharge: 2022-12-31 | Payer: MEDICAID | Attending: Family | Primary: Family

## 2022-12-31 ENCOUNTER — Ambulatory Visit: Admit: 2022-12-31 | Discharge: 2022-12-31 | Payer: MEDICAID

## 2022-12-31 DIAGNOSIS — Z79899 Other long term (current) drug therapy: Principal | ICD-10-CM

## 2022-12-31 DIAGNOSIS — Z9189 Other specified personal risk factors, not elsewhere classified: Principal | ICD-10-CM

## 2022-12-31 DIAGNOSIS — Z5181 Encounter for therapeutic drug level monitoring: Principal | ICD-10-CM

## 2022-12-31 DIAGNOSIS — E0590 Thyrotoxicosis, unspecified without thyrotoxic crisis or storm: Principal | ICD-10-CM

## 2022-12-31 DIAGNOSIS — Z1159 Encounter for screening for other viral diseases: Principal | ICD-10-CM

## 2022-12-31 DIAGNOSIS — Z23 Encounter for immunization: Principal | ICD-10-CM

## 2022-12-31 DIAGNOSIS — B20 Human immunodeficiency virus [HIV] disease: Principal | ICD-10-CM

## 2022-12-31 DIAGNOSIS — Z0184 Encounter for antibody response examination: Principal | ICD-10-CM

## 2022-12-31 MED ORDER — BICTEGRAVIR 50 MG-EMTRICITABINE 200 MG-TENOFOVIR ALAFENAM 25 MG TABLET
ORAL_TABLET | Freq: Every day | ORAL | 11 refills | 30 days | Status: CP
Start: 2022-12-31 — End: ?

## 2023-01-04 ENCOUNTER — Ambulatory Visit: Admit: 2023-01-04 | Discharge: 2023-01-05 | Payer: MEDICAID

## 2023-01-04 DIAGNOSIS — M797 Fibromyalgia: Principal | ICD-10-CM

## 2023-01-04 DIAGNOSIS — G894 Chronic pain syndrome: Principal | ICD-10-CM

## 2023-01-04 DIAGNOSIS — M5441 Lumbago with sciatica, right side: Principal | ICD-10-CM

## 2023-01-04 DIAGNOSIS — M5442 Lumbago with sciatica, left side: Principal | ICD-10-CM

## 2023-01-04 DIAGNOSIS — G8929 Other chronic pain: Principal | ICD-10-CM

## 2023-01-04 DIAGNOSIS — Z0289 Encounter for other administrative examinations: Principal | ICD-10-CM

## 2023-01-05 ENCOUNTER — Ambulatory Visit: Admit: 2023-01-05 | Discharge: 2023-01-06 | Payer: MEDICAID

## 2023-01-05 DIAGNOSIS — M5441 Lumbago with sciatica, right side: Principal | ICD-10-CM

## 2023-01-05 DIAGNOSIS — G8929 Other chronic pain: Principal | ICD-10-CM

## 2023-01-05 DIAGNOSIS — R202 Paresthesia of skin: Principal | ICD-10-CM

## 2023-01-05 DIAGNOSIS — M5442 Lumbago with sciatica, left side: Principal | ICD-10-CM

## 2023-01-19 ENCOUNTER — Ambulatory Visit: Admit: 2023-01-19 | Discharge: 2023-01-20 | Payer: MEDICAID

## 2023-01-20 ENCOUNTER — Ambulatory Visit: Admit: 2023-01-20 | Discharge: 2023-01-21 | Payer: MEDICAID

## 2023-01-20 DIAGNOSIS — R202 Paresthesia of skin: Principal | ICD-10-CM

## 2023-01-26 ENCOUNTER — Ambulatory Visit: Admit: 2023-01-26 | Discharge: 2023-01-27 | Payer: MEDICAID

## 2023-01-26 DIAGNOSIS — M8580 Other specified disorders of bone density and structure, unspecified site: Principal | ICD-10-CM

## 2023-01-26 DIAGNOSIS — I10 Essential (primary) hypertension: Principal | ICD-10-CM

## 2023-01-26 DIAGNOSIS — F3341 Major depressive disorder, recurrent, in partial remission: Principal | ICD-10-CM

## 2023-01-26 DIAGNOSIS — F17200 Nicotine dependence, unspecified, uncomplicated: Principal | ICD-10-CM

## 2023-01-26 DIAGNOSIS — G8929 Other chronic pain: Principal | ICD-10-CM

## 2023-01-26 DIAGNOSIS — M5442 Lumbago with sciatica, left side: Principal | ICD-10-CM

## 2023-01-26 DIAGNOSIS — E782 Mixed hyperlipidemia: Principal | ICD-10-CM

## 2023-01-26 DIAGNOSIS — G894 Chronic pain syndrome: Principal | ICD-10-CM

## 2023-01-26 DIAGNOSIS — M2550 Pain in unspecified joint: Principal | ICD-10-CM

## 2023-01-26 DIAGNOSIS — N879 Dysplasia of cervix uteri, unspecified: Principal | ICD-10-CM

## 2023-01-26 DIAGNOSIS — M5441 Lumbago with sciatica, right side: Principal | ICD-10-CM

## 2023-01-26 DIAGNOSIS — E0590 Thyrotoxicosis, unspecified without thyrotoxic crisis or storm: Principal | ICD-10-CM

## 2023-01-26 DIAGNOSIS — B20 Human immunodeficiency virus [HIV] disease: Principal | ICD-10-CM

## 2023-01-26 MED ORDER — DULOXETINE 30 MG CAPSULE,DELAYED RELEASE
ORAL_CAPSULE | Freq: Every day | ORAL | 3 refills | 90 days | Status: CP
Start: 2023-01-26 — End: 2024-01-26

## 2023-01-26 MED ORDER — PRAVASTATIN 40 MG TABLET
ORAL_TABLET | Freq: Every day | ORAL | 3 refills | 90 days | Status: CP
Start: 2023-01-26 — End: ?

## 2023-01-26 MED ORDER — LIDOCAINE 5 % TOPICAL PATCH
MEDICATED_PATCH | Freq: Two times a day (BID) | TRANSDERMAL | 5 refills | 15 days | Status: CP
Start: 2023-01-26 — End: 2024-01-26

## 2023-02-09 ENCOUNTER — Ambulatory Visit: Admit: 2023-02-09 | Discharge: 2023-02-10 | Payer: MEDICAID

## 2023-02-10 ENCOUNTER — Institutional Professional Consult (permissible substitution): Admit: 2023-02-10 | Payer: MEDICAID

## 2023-03-14 ENCOUNTER — Ambulatory Visit: Admit: 2023-03-14 | Discharge: 2023-03-15 | Payer: MEDICAID

## 2023-03-14 DIAGNOSIS — M5442 Lumbago with sciatica, left side: Principal | ICD-10-CM

## 2023-03-14 DIAGNOSIS — F17200 Nicotine dependence, unspecified, uncomplicated: Principal | ICD-10-CM

## 2023-03-14 DIAGNOSIS — M19042 Primary osteoarthritis, left hand: Principal | ICD-10-CM

## 2023-03-14 DIAGNOSIS — G8929 Other chronic pain: Principal | ICD-10-CM

## 2023-03-14 DIAGNOSIS — M5441 Lumbago with sciatica, right side: Principal | ICD-10-CM

## 2023-03-14 DIAGNOSIS — M19041 Primary osteoarthritis, right hand: Principal | ICD-10-CM

## 2023-03-14 MED ORDER — DICLOFENAC 1 % TOPICAL GEL
Freq: Four times a day (QID) | TOPICAL | 0 refills | 13 days | Status: CP
Start: 2023-03-14 — End: 2024-03-13

## 2023-03-14 MED ORDER — LEVETIRACETAM 250 MG TABLET
ORAL_TABLET | 3 refills | 0 days | Status: CP
Start: 2023-03-14 — End: ?

## 2023-03-14 MED ORDER — CELECOXIB 100 MG CAPSULE
ORAL_CAPSULE | Freq: Two times a day (BID) | ORAL | 5 refills | 30 days | Status: CP
Start: 2023-03-14 — End: 2023-09-10

## 2023-03-24 ENCOUNTER — Ambulatory Visit: Admit: 2023-03-24 | Payer: MEDICAID | Attending: "Endocrinology | Primary: "Endocrinology

## 2023-04-23 ENCOUNTER — Ambulatory Visit: Admit: 2023-04-23 | Discharge: 2023-04-24 | Payer: MEDICAID

## 2023-04-27 ENCOUNTER — Ambulatory Visit: Admit: 2023-04-27 | Discharge: 2023-04-27 | Payer: MEDICAID

## 2023-04-27 DIAGNOSIS — Z1231 Encounter for screening mammogram for malignant neoplasm of breast: Principal | ICD-10-CM

## 2023-04-27 DIAGNOSIS — I10 Essential (primary) hypertension: Principal | ICD-10-CM

## 2023-04-27 DIAGNOSIS — G8929 Other chronic pain: Principal | ICD-10-CM

## 2023-04-27 DIAGNOSIS — M542 Cervicalgia: Principal | ICD-10-CM

## 2023-04-27 DIAGNOSIS — M5441 Lumbago with sciatica, right side: Principal | ICD-10-CM

## 2023-04-27 DIAGNOSIS — J449 Chronic obstructive pulmonary disease, unspecified: Principal | ICD-10-CM

## 2023-04-27 DIAGNOSIS — M47816 Spondylosis without myelopathy or radiculopathy, lumbar region: Principal | ICD-10-CM

## 2023-04-27 DIAGNOSIS — G894 Chronic pain syndrome: Principal | ICD-10-CM

## 2023-04-27 DIAGNOSIS — M5442 Lumbago with sciatica, left side: Principal | ICD-10-CM

## 2023-04-27 DIAGNOSIS — R059 Cough, unspecified type: Principal | ICD-10-CM

## 2023-04-27 DIAGNOSIS — M47812 Spondylosis without myelopathy or radiculopathy, cervical region: Principal | ICD-10-CM

## 2023-04-27 MED ORDER — GABAPENTIN 300 MG CAPSULE
ORAL_CAPSULE | Freq: Every evening | ORAL | 3 refills | 135 days | Status: CP
Start: 2023-04-27 — End: 2024-04-26

## 2023-04-27 MED ORDER — AZITHROMYCIN 250 MG TABLET
ORAL_TABLET | 0 refills | 5 days | Status: CP
Start: 2023-04-27 — End: 2023-05-02

## 2023-04-27 MED ORDER — PREGABALIN 75 MG CAPSULE
ORAL_CAPSULE | Freq: Two times a day (BID) | ORAL | 3 refills | 90 days | Status: CP
Start: 2023-04-27 — End: 2024-04-26

## 2023-04-27 MED ORDER — TIOTROPIUM BROMIDE 18 MCG CAPSULE WITH INHALATION DEVICE
ORAL_CAPSULE | Freq: Every day | RESPIRATORY_TRACT | 11 refills | 30 days | Status: CP
Start: 2023-04-27 — End: 2024-04-26

## 2023-04-27 MED ORDER — BUDESONIDE-FORMOTEROL HFA 80 MCG-4.5 MCG/ACTUATION AEROSOL INHALER
Freq: Two times a day (BID) | RESPIRATORY_TRACT | 5 refills | 21 days | Status: CP
Start: 2023-04-27 — End: 2024-04-26

## 2023-04-28 DIAGNOSIS — J449 Chronic obstructive pulmonary disease, unspecified: Principal | ICD-10-CM

## 2023-05-30 DIAGNOSIS — I10 Essential (primary) hypertension: Principal | ICD-10-CM

## 2023-05-30 DIAGNOSIS — B20 Human immunodeficiency virus [HIV] disease: Principal | ICD-10-CM

## 2023-05-30 DIAGNOSIS — G894 Chronic pain syndrome: Principal | ICD-10-CM

## 2023-05-30 DIAGNOSIS — Z79899 Other long term (current) drug therapy: Principal | ICD-10-CM

## 2023-05-30 DIAGNOSIS — F5101 Primary insomnia: Principal | ICD-10-CM

## 2023-05-30 DIAGNOSIS — R059 Cough, unspecified type: Principal | ICD-10-CM

## 2023-05-30 DIAGNOSIS — J302 Other seasonal allergic rhinitis: Principal | ICD-10-CM

## 2023-05-30 DIAGNOSIS — M5442 Lumbago with sciatica, left side: Principal | ICD-10-CM

## 2023-05-30 DIAGNOSIS — E782 Mixed hyperlipidemia: Principal | ICD-10-CM

## 2023-05-30 DIAGNOSIS — M5441 Lumbago with sciatica, right side: Principal | ICD-10-CM

## 2023-05-30 DIAGNOSIS — M2550 Pain in unspecified joint: Principal | ICD-10-CM

## 2023-05-30 DIAGNOSIS — G8929 Other chronic pain: Principal | ICD-10-CM

## 2023-05-30 MED ORDER — TIOTROPIUM BROMIDE 18 MCG CAPSULE WITH INHALATION DEVICE
ORAL_CAPSULE | Freq: Every day | RESPIRATORY_TRACT | 11 refills | 30 days | Status: CP
Start: 2023-05-30 — End: 2024-05-29

## 2023-05-30 MED ORDER — PRAVASTATIN 40 MG TABLET
ORAL_TABLET | Freq: Every day | ORAL | 3 refills | 90 days | Status: CP
Start: 2023-05-30 — End: ?
  Filled 2023-07-06: qty 90, 90d supply, fill #0

## 2023-05-30 MED ORDER — BUDESONIDE-FORMOTEROL HFA 80 MCG-4.5 MCG/ACTUATION AEROSOL INHALER
Freq: Two times a day (BID) | RESPIRATORY_TRACT | 5 refills | 21 days | Status: CP
Start: 2023-05-30 — End: 2024-05-29

## 2023-05-30 MED ORDER — FLUTICASONE PROPIONATE 50 MCG/ACTUATION NASAL SPRAY,SUSPENSION
Freq: Every day | NASAL | 3 refills | 120 days | Status: CP
Start: 2023-05-30 — End: 2024-05-29

## 2023-05-30 MED ORDER — TRAZODONE 50 MG TABLET
ORAL_TABLET | Freq: Every evening | ORAL | 3 refills | 90 days | Status: CP
Start: 2023-05-30 — End: ?

## 2023-05-30 MED ORDER — AMLODIPINE 10 MG TABLET
ORAL_TABLET | Freq: Every day | ORAL | 3 refills | 90 days | Status: CP
Start: 2023-05-30 — End: ?

## 2023-05-30 MED ORDER — OLMESARTAN 20 MG TABLET
ORAL_TABLET | Freq: Every day | ORAL | 3 refills | 90 days | Status: CP
Start: 2023-05-30 — End: 2024-05-29

## 2023-05-31 MED ORDER — CELECOXIB 100 MG CAPSULE
ORAL_CAPSULE | Freq: Two times a day (BID) | ORAL | 5 refills | 30 days | Status: CP
Start: 2023-05-31 — End: 2023-11-27
  Filled 2023-07-06: qty 60, 30d supply, fill #0

## 2023-05-31 MED ORDER — DULOXETINE 60 MG CAPSULE,DELAYED RELEASE
ORAL_CAPSULE | Freq: Every day | ORAL | 3 refills | 90 days | Status: CP
Start: 2023-05-31 — End: 2024-05-25
  Filled 2023-07-06: qty 90, 90d supply, fill #0

## 2023-05-31 MED ORDER — PREGABALIN 75 MG CAPSULE
ORAL_CAPSULE | Freq: Two times a day (BID) | ORAL | 3 refills | 90 days | Status: CP
Start: 2023-05-31 — End: 2024-05-30
  Filled 2023-06-07: qty 180, 90d supply, fill #0

## 2023-06-03 ENCOUNTER — Ambulatory Visit: Admit: 2023-06-03 | Discharge: 2023-06-04 | Payer: PRIVATE HEALTH INSURANCE | Attending: Family | Primary: Family

## 2023-06-03 DIAGNOSIS — M5442 Lumbago with sciatica, left side: Principal | ICD-10-CM

## 2023-06-03 DIAGNOSIS — M5441 Lumbago with sciatica, right side: Principal | ICD-10-CM

## 2023-06-03 DIAGNOSIS — Z79899 Other long term (current) drug therapy: Principal | ICD-10-CM

## 2023-06-03 DIAGNOSIS — Z113 Encounter for screening for infections with a predominantly sexual mode of transmission: Principal | ICD-10-CM

## 2023-06-03 DIAGNOSIS — E0590 Thyrotoxicosis, unspecified without thyrotoxic crisis or storm: Principal | ICD-10-CM

## 2023-06-03 DIAGNOSIS — B20 Human immunodeficiency virus [HIV] disease: Principal | ICD-10-CM

## 2023-06-03 DIAGNOSIS — Z1159 Encounter for screening for other viral diseases: Principal | ICD-10-CM

## 2023-06-03 DIAGNOSIS — Z0184 Encounter for antibody response examination: Principal | ICD-10-CM

## 2023-06-03 DIAGNOSIS — G894 Chronic pain syndrome: Principal | ICD-10-CM

## 2023-06-03 DIAGNOSIS — G8929 Other chronic pain: Principal | ICD-10-CM

## 2023-06-03 DIAGNOSIS — Z5181 Encounter for therapeutic drug level monitoring: Principal | ICD-10-CM

## 2023-06-03 DIAGNOSIS — Z23 Encounter for immunization: Principal | ICD-10-CM

## 2023-06-03 DIAGNOSIS — Z9189 Other specified personal risk factors, not elsewhere classified: Principal | ICD-10-CM

## 2023-06-03 LAB — ALBUMIN / CREATININE URINE RATIO
ALBUMIN QUANT URINE: <0.3 mg/dL
CREATININE, URINE: 74.5 mg/dL

## 2023-06-03 LAB — BASIC METABOLIC PANEL
ANION GAP: 3 mmol/L — ABNORMAL LOW (ref 5–14)
BLOOD UREA NITROGEN: 11 mg/dL (ref 9–23)
BUN / CREAT RATIO: 14
CALCIUM: 10.2 mg/dL (ref 8.7–10.4)
CHLORIDE: 105 mmol/L (ref 98–107)
CO2: 28.6 mmol/L (ref 20.0–31.0)
CREATININE: 0.8 mg/dL
EGFR CKD-EPI (2021) FEMALE: 86 mL/min/{1.73_m2} (ref >=60)
GLUCOSE RANDOM: 91 mg/dL (ref 70–179)
POTASSIUM: 4 mmol/L (ref 3.4–4.8)
SODIUM: 137 mmol/L (ref 135–145)

## 2023-06-03 LAB — URINALYSIS WITH MICROSCOPY WITH CULTURE REFLEX PERFORMABLE
BILIRUBIN UA: NEGATIVE
BLOOD UA: NEGATIVE
GLUCOSE UA: NEGATIVE
KETONES UA: NEGATIVE
LEUKOCYTE ESTERASE UA: NEGATIVE
NITRITE UA: NEGATIVE
PH UA: 6 (ref 5.0–9.0)
PROTEIN UA: NEGATIVE
RBC UA: <1 /HPF (ref 0–3)
SPECIFIC GRAVITY UA: 1.01 (ref 1.005–1.030)
SQUAMOUS EPITHELIAL: <1 /HPF (ref 0–5)
UROBILINOGEN UA: 1
WBC UA: <1 /HPF (ref 0–3)

## 2023-06-03 LAB — CBC W/ AUTO DIFF
BASOPHILS ABSOLUTE COUNT: 0.1 10*9/L (ref 0.0–0.1)
BASOPHILS RELATIVE PERCENT: 0.7 %
EOSINOPHILS ABSOLUTE COUNT: 0.1 10*9/L (ref 0.0–0.5)
EOSINOPHILS RELATIVE PERCENT: 1.1 %
HEMATOCRIT: 42.2 % (ref 34.0–44.0)
HEMOGLOBIN: 14.2 g/dL (ref 11.3–14.9)
LYMPHOCYTES ABSOLUTE COUNT: 2.6 10*9/L (ref 1.1–3.6)
LYMPHOCYTES RELATIVE PERCENT: 29.1 %
MEAN CORPUSCULAR HEMOGLOBIN CONC: 33.7 g/dL (ref 32.0–36.0)
MEAN CORPUSCULAR HEMOGLOBIN: 32.7 pg — ABNORMAL HIGH (ref 25.9–32.4)
MEAN CORPUSCULAR VOLUME: 97.1 fL — ABNORMAL HIGH (ref 77.6–95.7)
MEAN PLATELET VOLUME: 9.3 fL (ref 6.8–10.7)
MONOCYTES ABSOLUTE COUNT: 0.3 10*9/L (ref 0.3–0.8)
MONOCYTES RELATIVE PERCENT: 3.4 %
NEUTROPHILS ABSOLUTE COUNT: 5.8 10*9/L (ref 1.8–7.8)
NEUTROPHILS RELATIVE PERCENT: 65.7 %
PLATELET COUNT: 245 10*9/L (ref 150–450)
RED BLOOD CELL COUNT: 4.35 10*12/L (ref 3.95–5.13)
RED CELL DISTRIBUTION WIDTH: 14 % (ref 12.2–15.2)
WBC ADJUSTED: 8.8 10*9/L (ref 3.6–11.2)

## 2023-06-03 LAB — BILIRUBIN, TOTAL: BILIRUBIN TOTAL: 0.2 mg/dL — ABNORMAL LOW (ref 0.3–1.2)

## 2023-06-03 LAB — AST: AST (SGOT): 17 U/L (ref <=34)

## 2023-06-03 LAB — ALT: ALT (SGPT): 15 U/L (ref 10–49)

## 2023-06-03 MED ORDER — ALBUTEROL SULFATE HFA 90 MCG/ACTUATION AEROSOL INHALER
RESPIRATORY_TRACT | 2 refills | 0 days | PRN
Start: 2023-06-03 — End: 2024-06-02

## 2023-06-03 MED ORDER — GABAPENTIN 300 MG CAPSULE
ORAL_CAPSULE | Freq: Every evening | ORAL | 3 refills | 135 days
Start: 2023-06-03 — End: 2024-06-02

## 2023-06-03 MED ORDER — DICLOFENAC 1 % TOPICAL GEL
Freq: Four times a day (QID) | TOPICAL | 0 refills | 13 days
Start: 2023-06-03 — End: 2024-06-02

## 2023-06-03 MED ORDER — BICTEGRAVIR 50 MG-EMTRICITABINE 200 MG-TENOFOVIR ALAFENAM 25 MG TABLET
ORAL_TABLET | Freq: Every day | ORAL | 11 refills | 30 days | Status: CP
Start: 2023-06-03 — End: ?

## 2023-06-03 MED ORDER — LIDOCAINE 5 % TOPICAL PATCH
MEDICATED_PATCH | Freq: Two times a day (BID) | TRANSDERMAL | 5 refills | 15 days
Start: 2023-06-03 — End: 2024-06-02

## 2023-06-03 NOTE — Unmapped (Signed)
Patient completed RW paperwork. Patient is eligible for RW B&C grant services and Caps on Charges. IPL= 0%;FPL= 0%. Expires: 11/21/2024- Rw0    Kristy Garcia  ID Clinic Benefits Counselor  Time of Intervention-24mins

## 2023-06-03 NOTE — Unmapped (Signed)
COVID Education:  Make sure you perform good hand washing (lasting 20 seconds), continue to social distance and limit close personal contact (which may include new sexual partners or having multiple partners during this period).  Try to isolate at home but please find ways to keep in touch with those close to you, such as meeting up with them electronically or socially distanced, and the ability to go outdoors alone or separated from others  If you become ill with fever, respiratory illness, sudden loss of taste and smell, stomach issues, diarrhea, nausea, vomiting - contact clinic for further instructions.  You should go to the emergency department if you develop systems such as shortness of breath, confusion, lightheadedness when standing, high fever.   Here is some information about HIV and CoVid vaccines: [email or web address]  If you're interested in receiving the CoVid vaccine when you're eligible, here are some resources for you to check and make an appointment:  Your local health department   [email or web address] through [facility]  [email or web address]  [facility] (if you are an established patient with them)  [email or web address]    URGENT CARE  Please call ahead to speak with the nursing staff if you are in need of an urgent appointment.       MEDICATIONS  For refills please contact your pharmacy and ask them to electronically send or fax the request to the clinic.   Please bring all medications in original bottles to every appointment.    [employer] (formerly [employer]) or [employer] Eligibility (required even if you do not receive medication through [facility])  Please remember to renew your Kristy Garcia eligibility during renewal periods which occur twice a year: January-March and July-September.     The following are needed for each renewal:   - [facility] Identification (if you don't have one, then a bill with your name and address in [place])   - proof of income (award letter, W-2, or last three check stubs)   If you are unable to come in for renewal, let us know if we can mail, fax or e-mail paperwork to you.   [facility] Contact: [phone].     Lab info:  Your most recent CD4 T-cell counts and viral loads are below. Here are a few things to keep in mind when looking at your numbers:  Our goal is to get your virus to be undetectable and keep it undetectable. If the virus is undetectable you are much more likely to stay healthy.  We consider your viral load to be undetectable if it says <40 or if it says Not detected.  For most people, we're checking CD4 counts every other visit (once or twice a year, or sometimes even less).  It's normal for your CD4 count to be different from visit to visit.   You can help by taking your medications at about the same time, every single day. If you're having trouble with taking your medications, it's important to let us know.    Lab Results   Component Value Date    ACD4 1,643 07/30/2022    CD4 53 07/30/2022    HIVCP 228 (H) 07/19/2016    HIVRS Not Detected 12/31/2022        Please note that your laboratory and other results may be visible to you in real time, possibly before they reach your provider. Please allow 48 hours for clinical interpretation of these results. Importantly, even if a result is flagged as abnormal, it may not be one that impacts  your health.    It was nice to have a visit with you today!  Follow-up information:        Provider today:  Varney Daily, FNP-BC      ID CLINIC address:   [facility]  [address]  [place], [place] [address]    Contact information:    The ID clinic phone number is [phone]   The ID clinic fax number is [phone]  For urgent issues on nights and weekends: Call the ID Physician on-call through the [facility] Operator at [phone].    Please sign up for My [facility] Chart - This is a great way to review your labs and track your appointments    Please try to arrive 30 minutes BEFORE your scheduled appointment time!  This will give you time to fill out any front desk paperwork needed for your visit, and allow you to be seen as close to your scheduled appointment time as possible.
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
## 2023-06-03 NOTE — Unmapped (Signed)
Name: Kristy Garcia  Date: 06/03/2023  Address: [address] [place] [address]   [place] of Residence:  [place]  Phone: [phone]     Started assessment with patient options: in clinic     Is this the same address for mailing? Yes  If No, Mailing Address is:     Housing Status  Stable/Permanent    Insurance  Medicaid    Marital Status  Single    Tax Press photographer Status  Single    Employment Status  Unemployed    Income  No Household Income/Deductions of any kind    If no or low income, how are you meeting your basic needs?  Family Support and Food Stamps/EBT    List Tax Household Members including relationship to you:   N/a    Someone in my household receives: No Household Income/Deductions of any kind  Specify who: n/a    Medication Access/Barriers: none    Do you have a current diagnosis for Hepatitis C?  Lab Results   Component Value Date    HEPCAB Nonreactive 03/25/2021       Have you used tobacco products four or more times per week in the last six months?  Yes    Teacher, adult education  Patient has affordable insurance through [employer], [place], and or Employment and is not eligible.    Patient given ACA education if they qualified based on answers to questions above.     MyChart  Do you have an active MyChart account? Yes     If MyChart is not set up, informed patient on how to set up MyChart N/A    Patient was informed of the following programs;   N/A    The following applications/handouts were given to patient:   N/A    The following forms were also started with the patient:   N/A    Medicaid:  Approved      Juanell Fairly application status: Complete    Patient is applying for [employer] on Charges Only     Additional Comments: No issues with meds. No longer working.      Rhetta Mura  [facility] Clinic Benefits Counselor  Time of Intervention-18mins

## 2023-06-03 NOTE — Unmapped (Signed)
[facility] Specialty Medication Onboarding    Specialty Medication: BIKTARVY 50-200-25 mg tablet (bictegrav-emtricit-tenofov ala)  Prior Authorization: Not Required   Financial Assistance: No - copay  <$25  Final Copay/Day Supply: $0 / 30    Insurance Restrictions: None     Notes to Pharmacist:   Credit Card on File: not applicable    The triage team has completed the benefits investigation and has determined that the patient is able to fill this medication at [facility]. Please contact the patient to complete the onboarding or follow up with the prescribing physician as needed.

## 2023-06-04 LAB — LYMPH MARKER LIMITED,FLOW
ABSOLUTE CD3 CNT: 2106 {cells}/uL (ref 915–3400)
ABSOLUTE CD4 CNT: 1378 {cells}/uL (ref 510–2320)
ABSOLUTE CD8 CNT: 728 {cells}/uL (ref 180–1520)
CD3% (T CELLS): 81 % (ref 61–86)
CD4% (T HELPER): 53 % (ref 34–58)
CD4:CD8 RATIO: 1.9 (ref 0.9–4.8)
CD8% T SUPPRESR: 28 % (ref 12–38)

## 2023-06-04 NOTE — Unmapped (Signed)
[facility]   Patient Onboarding/Medication Counseling    Kristy Garcia is a 57 y.o. female with HIV who I am counseling today on continuation of therapy.  I am speaking to the patient.    Was a Nurse, learning disability used for this call? No    Verified patient's date of birth / HIPAA.    Specialty medication(s) to be sent: Infectious Disease: Biktarvy      Non-specialty medications/supplies to be sent:   Olmesartan 20mg   Fluticasone nasal spray 64mcg/actuation  Amlodipine10mg   Pregabalin 75mg   Lidocaine 5% patch (new Rx needed: request sent)  Gabapentin 300mg  (new Rx needed: request sent)  Albuterol HFA 67mcg/actuation inhaler (new Rx needed: request sent)  Diclofenac 1% gel (new Rx needed: request sent)  Aripiprazole 2mg  (new Rx needed: will contact outside pharmacy for transfer or reach out to the provider    Medications not needed at this time:   Duloxetine 60mg  (rfts until 06/19/23)  Celecoxib 100mg  (rfts until 06/19/23)  Pravastatin 40mg  (rfts until 06/19/23)  Trazodone 50mg  (rfts until 06/23/23)  Symbicort 80-4.52mcg (prior authorization needed)  Spiriva (prior authorization needed)         Biktarvy (bictegravir, emtricitabine, and tenofovir alafenamide) 50-200-25mg     The patient declined counseling on medication administration, missed dose instructions, goals of therapy, side effects and monitoring parameters, warnings and precautions, drug/food interactions, and storage, handling precautions, and disposal because they have taken the medication previously. The information in the declined sections below are for informational purposes only and was not discussed with patient.       Medication & Administration     Dosage: Take 1 tablet by mouth daily    Administration: Take without regard to food    Adherence/Missed dose instructions: take missed dose as soon as you remember. If it is close to the time of your next dose, skip the dose and resume with your next scheduled dose.    Goals of Therapy To suppress viral replication and keep patient's HIV undetectable by lab tests    Side Effects & Monitoring Parameters     Common Side Effects:  Diarrhea  Upset stomach  Headache  Changes in Weight  Changes in mood    The following side effects should be reported to the provider:     If patient experiences: signs of an allergic reaction (rash; hives; itching; red, swollen, blistered, or peeling skin with or without fever; wheezing; tightness in the chest or throat; trouble breathing, swallowing, or talking; unusual hoarseness; or swelling of the mouth, face, lips, tongue, or throat)  signs of kidney problems (unable to pass urine, change in how much urine is passed, blood in the urine, or a big weight gain)  signs of liver problems (dark urine, feeling tired, not hungry, upset stomach or stomach pain, light-colored stools, throwing up, or yellow skin or eyes)  signs of lactic acidosis (fast breathing, fast heartbeat, a heartbeat that does not feel normal, very bad upset stomach or throwing up, feeling very sleepy, shortness of breath, feeling very tired or weak, very bad dizziness, feeling cold, or muscle pain or cramps)  Weight gain: some patients have reported weight gain after starting this medication. The amount of weight can vary.    Monitoring Parameters:  CD4  Count  HIV RNA plasma levels,  Liver function  Total bilirubin  serum creatinine  urine glucose  urine protein (prior to or when initiating therapy and as clinically indicated during therapy);       Drug/Food  Interactions     Medication list reviewed in Epic. The patient was instructed to inform the care team before taking any new medications or supplements. No drug interactions identified.   Calcium Salts: May decrease the serum concentration of Biktarvy. If taken with food, Biktarvy can be administered with calcium salts.   Iron Preparations: May decrease the serum concentration of Biktarvy. If taken with food, Biktarvy can be administered with Ferrous sulfate. If taken on an empty stomach, Biktarvy must be taken 2 hours before ferrous sulfate. Avoid other iron salts.    Contraindications, Warnings, & Precautions     Black Box Warning: Severe acute exacertbations of HBV have been reported in patients coinfected with HIV-1 and HBV fllowing discontinuation of therapy  Coadministration with dofetilide, rifampin is contraindicated  Immune reconstitution syndrome: Patients may develop immune reconstitution syndrome, resulting in the occurrence of an inflammatory response to an indolent or residual opportunistic infection or activation of autoimmune disorders (eg, Graves disease, polymyositis, Guillain-Barr?? syndrome, autoimmune hepatitis)   Lactic acidosis/hepatomegaly  Renal toxicity: patients with preexisting renal impairment and those taking nephrotoxic agents (including NSAIDs) are at increased risk.     Storage, Handling Precautions, & Disposal     Store in the original container at room temperature.   Keep lid tightly closed.   Store in a dry place. Do not store in a bathroom.   Keep all drugs in a safe place. Keep all drugs out of the reach of children and pets.   Throw away unused or expired drugs. Do not flush down a toilet or pour down a drain unless you are told to do so. Check with your pharmacist if you have questions about the best way to throw out drugs. There may be drug take-back programs in your area.      Current Medications (including OTC/herbals), Comorbidities and Allergies     Current Outpatient Medications   Medication Sig Dispense Refill    acetaminophen (TYLENOL 8 HOUR) 650 MG CR tablet Take 2 tablets (1,300 mg total) by mouth in the morning.      albuterol HFA 90 mcg/actuation inhaler Inhale 2 puffs every four (4) hours as needed for wheezing. 18 g 2    amlodipine (NORVASC) 10 MG tablet Take 1 tablet (10 mg total) by mouth daily. 90 tablet 3    aripiprazole (ABILIFY) 2 MG tablet Take 1 tablet (2 mg total) by mouth every morning. B-complex with vitamin C (TOTAL B W/C) tablet Take 1 tablet by mouth daily. 30 tablet 12    bictegrav-emtricit-tenofov ala (BIKTARVY) 50-200-25 mg tablet Take 1 tablet by mouth daily. 30 tablet 11    budesonide-formoterol (SYMBICORT) 80-4.5 mcg/actuation inhaler Inhale 2 puffs two (2) times a day. 6.9 g 5    celecoxib (CELEBREX) 100 MG capsule Take 1 capsule (100 mg total) by mouth two (2) times a day. 60 capsule 5    diclofenac sodium (VOLTAREN) 1 % gel Apply 2 g topically four (4) times a day. 100 g 0    DULoxetine (CYMBALTA) 60 MG capsule Take 1 capsule (60 mg total) by mouth daily. 90 capsule 3    fluticasone propionate (FLONASE) 50 mcg/actuation nasal spray 1 spray into each nostril daily. 16 g 3    gabapentin (NEURONTIN) 300 MG capsule Take 2 capsules (600 mg total) by mouth at bedtime. 270 capsule 3    lidocaine (LIDODERM) 5 % patch Place 1 patch on the skin every twelve (12) hours. Apply to affected area for 12 hours only each  day (then remove patch) 30 patch 5    olmesartan (BENICAR) 20 MG tablet Take 1 tablet (20 mg total) by mouth daily. 90 tablet 3    pravastatin (PRAVACHOL) 40 MG tablet Take 1 tablet (40 mg total) by mouth daily. 90 tablet 3    pregabalin (LYRICA) 75 MG capsule Take 1 capsule (75 mg total) by mouth two (2) times a day. 180 capsule 3    tiotropium (SPIRIVA) 18 mcg inhalation capsule Place 1 capsule (18 mcg total) into inhaler and inhale daily. 30 capsule 11    traZODone (DESYREL) 50 MG tablet Take 1 tablet (50 mg total) by mouth nightly. 90 tablet 3     No current facility-administered medications for this visit.       Allergies   Allergen Reactions    Codeine Nausea And Vomiting, Rash and Hives    Ibuprofen      Pt reports  I cannot take this.  I have ulcers    Adhesive Tape-Silicones     Adhesive Itching       Patient Active Problem List   Diagnosis    Human immunodeficiency virus (HIV) disease (CMS-HCC)    Hyperthyroidism    Suicide and self-inflicted injury (CMS-HCC)    Tobacco use disorder    Cocaine use disorder, mild, in sustained remission, abuse (CMS-HCC)    Alcohol use disorder, moderate, in sustained remission (CMS-HCC)    HIV (human immunodeficiency virus infection) (CMS-HCC)    Essential hypertension (RAF-HCC)    Hyperlipidemia    Osteopenia    Sciatica of right side    Cervical dysplasia    Joint pain in both hands    Chronic pain of left ankle    Arthralgia    Recurrent major depressive disorder, in partial remission (CMS-HCC)    Colon cancer screening    Chronic midline low back pain with bilateral sciatica    Alcohol use disorder    Primary osteoarthritis of both hands       Reviewed and up to date in Epic.    HIV ASSOCIATED LABS:     Lab Results   Component Value Date/Time    HIVRS Not Detected 12/31/2022 01:48 PM    HIVRS Not Detected 07/30/2022 02:26 PM    HIVRS Not Detected 02/26/2022 04:35 PM    HIVRS Not Detected 08/15/2014 12:38 PM    HIVRS Not Detected 02/07/2014 10:56 AM    HIVRS Not Detected 10/01/2013 12:56 PM    HIVCP 228 (H) 07/19/2016 11:20 AM    HIVCP < 40 12/14/2012 11:46 AM    HIVCP 18707 08/31/2012 02:40 PM    HIVCP 161096 07/17/2012 04:19 PM    ACD4 1,643 07/30/2022 02:26 PM    ACD4 1,664 02/26/2022 04:38 PM    ACD4 1,248 03/25/2021 11:41 AM    ACD4 970 08/15/2014 12:38 PM    ACD4 1,296 02/07/2014 10:56 AM    ACD4 1,490 10/01/2013 12:56 PM       Appropriateness of Therapy     Acute infections noted within Epic:  No active infections  Patient reported infection: None    Is medication and dose appropriate based on diagnosis and infection status? Yes    Prescription has been clinically reviewed: Yes      Baseline Quality of Life Assessment      How many days over the past month did your HIV  keep you from your normal activities? For example, brushing your teeth or getting up in the morning. 0  Financial Information     Medication Assistance provided: None Required    Anticipated copay of $0.00 reviewed with patient. Verified delivery address.    Delivery Information     Scheduled delivery date: 06/07/23    Expected start date: 06/07/23      Medication will be delivered via Same Day Courier to the prescription address in [facility].  This shipment  will  require a signature.      Explained the services we provide at [facility] and that each month we would call to set up refills.  Stressed importance of returning phone calls so that we could ensure they receive their medications in time each month.  Informed patient that we should be setting up refills 7-10 days prior to when they will run out of medication.  A pharmacist will reach out to perform a clinical assessment periodically.  Informed patient that a welcome packet, containing information about our pharmacy and other support services, a Notice of Privacy Practices, and a drug information handout will be sent.      The patient or caregiver noted above participated in the development of this care plan and knows that they can request review of or adjustments to the care plan at any time.      Patient or caregiver verbalized understanding of the above information as well as how to contact the pharmacy at [phone] option 4 with any questions/concerns.  The pharmacy is open Monday through Friday 8:30am-4:30pm.  A pharmacist is available 24/7 via pager to answer any clinical questions they may have.    Patient Specific Needs     Does the patient have any physical, cognitive, or cultural barriers? No    Does the patient have adequate living arrangements? (i.e. the ability to store and take their medication appropriately) Yes    Did you identify any home environmental safety or security hazards? No    Patient prefers to have medications discussed with  Patient     Is the patient or caregiver able to read and understand education materials at a high school level or above? Yes    Patient's primary language is  English     Is the patient high risk? No    SOCIAL DETERMINANTS OF HEALTH     At the [facility], we have learned that life circumstances - like trouble affording food, housing, utilities, or transportation can affect the health of many of our patients.   That is why we wanted to ask: are you currently experiencing any life circumstances that are negatively impacting your health and/or quality of life? Patient declined to answer    Social Determinants of Health     Financial Resource Strain: High Risk (04/07/2022)    Overall Financial Resource Strain (CARDIA)     Difficulty of Paying Living Expenses: Very hard   Internet Connectivity: Not on file   Food Insecurity: Food Insecurity Present (04/07/2022)    Hunger Vital Sign     Worried About Running Out of Food in the Last Year: Sometimes true     Ran Out of Food in the Last Year: Sometimes true   Tobacco Use: High Risk (04/27/2023)    Patient History     Smoking Tobacco Use: Every Day     Smokeless Tobacco Use: Former     Passive Exposure: Not on file   Housing/Utilities: High Risk (04/07/2022)    Housing/Utilities     Within the past 12 months, have you ever  stayed: outside, in a car, in a tent, in an overnight shelter, or temporarily in someone else's home (i.e. couch-surfing)?: Yes     Are you worried about losing your housing?: Yes     Within the past 12 months, have you been unable to get utilities (heat, electricity) when it was really needed?: Yes   Alcohol Use: Not At Risk (01/26/2023)    Alcohol Use     How often do you have a drink containing alcohol?: Monthly or less     How many drinks containing alcohol do you have on a typical day when you are drinking?: 1 - 2     How often do you have 5 or more drinks on one occasion?: Never   Transportation Needs: No Transportation Needs (04/07/2022)    PRAPARE - Therapist, art (Medical): No     Lack of Transportation (Non-Medical): No   Recent Concern: Transportation Needs - Unmet Transportation Needs (03/29/2022)    PRAPARE - Therapist, art (Medical): Yes     Lack of Transportation (Non-Medical): Yes   Substance Use: Not on file   Health Literacy: Not on file   Physical Activity: Not on file   Interpersonal Safety: Unknown (06/03/2023)    Interpersonal Safety     Unsafe Where You Currently Live: Not on file     Physically Hurt by Anyone: Not on file     Abused by Anyone: Not on file   Stress: Not on file   Intimate Partner Violence: Not At Risk (07/22/2021)    Humiliation, Afraid, Rape, and Kick questionnaire     Fear of Current or Ex-Partner: No     Emotionally Abused: No     Physically Abused: No     Sexually Abused: No   Depression: Not at risk (01/26/2023)    PHQ-2     PHQ-2 Score: 0   Social Connections: Not on file       Would you be willing to receive help with any of the needs that you have identified today? Not applicable       Roderic Palau, PharmD  [facility] Specialty Pharmacist

## 2023-06-06 ENCOUNTER — Ambulatory Visit: Admit: 2023-06-06 | Payer: PRIVATE HEALTH INSURANCE

## 2023-06-06 DIAGNOSIS — F5101 Primary insomnia: Principal | ICD-10-CM

## 2023-06-06 DIAGNOSIS — B20 Human immunodeficiency virus [HIV] disease: Principal | ICD-10-CM

## 2023-06-06 DIAGNOSIS — M2550 Pain in unspecified joint: Principal | ICD-10-CM

## 2023-06-06 DIAGNOSIS — Z79899 Other long term (current) drug therapy: Principal | ICD-10-CM

## 2023-06-06 DIAGNOSIS — G894 Chronic pain syndrome: Principal | ICD-10-CM

## 2023-06-06 DIAGNOSIS — I10 Essential (primary) hypertension: Principal | ICD-10-CM

## 2023-06-06 LAB — SYPHILIS SCREEN: SYPHILIS RPR SCREEN: NONREACTIVE

## 2023-06-06 LAB — HEPATITIS B SURFACE ANTIBODY
HEPATITIS B SURFACE ANTIBODY QUANT: >1000 m[IU]/mL — ABNORMAL HIGH (ref <8.00)
HEPATITIS B SURFACE ANTIBODY: REACTIVE — AB

## 2023-06-06 MED ORDER — LIDOCAINE 5 % TOPICAL PATCH
MEDICATED_PATCH | Freq: Two times a day (BID) | TRANSDERMAL | 5 refills | 15 days | Status: CP
Start: 2023-06-06 — End: 2024-06-05
  Filled 2023-06-07: qty 60, 30d supply, fill #0

## 2023-06-06 MED ORDER — BICTEGRAVIR 50 MG-EMTRICITABINE 200 MG-TENOFOVIR ALAFENAM 25 MG TABLET
ORAL_TABLET | Freq: Every day | ORAL | 11 refills | 30 days | Status: CP
Start: 2023-06-06 — End: ?

## 2023-06-06 MED ORDER — TRAZODONE 50 MG TABLET
ORAL_TABLET | Freq: Every evening | ORAL | 3 refills | 90 days | Status: CP
Start: 2023-06-06 — End: ?
  Filled 2023-07-06: qty 180, 90d supply, fill #0

## 2023-06-06 MED ORDER — GABAPENTIN 300 MG CAPSULE
ORAL_CAPSULE | Freq: Every evening | ORAL | 1 refills | 135 days | Status: CP
Start: 2023-06-06 — End: 2024-06-05

## 2023-06-06 MED ORDER — AMLODIPINE 10 MG TABLET
ORAL | 3 refills | 90 days | Status: CP
Start: 2023-06-06 — End: ?

## 2023-06-06 MED ORDER — DICLOFENAC 1 % TOPICAL GEL
Freq: Four times a day (QID) | TOPICAL | 5 refills | 57 days | Status: CP
Start: 2023-06-06 — End: 2024-06-05
  Filled 2023-06-07: qty 500, 63d supply, fill #0

## 2023-06-06 MED ORDER — OLMESARTAN 20 MG TABLET
ORAL_TABLET | Freq: Every day | ORAL | 3 refills | 90 days | Status: CP
Start: 2023-06-06 — End: 2024-06-05

## 2023-06-06 MED ORDER — ALBUTEROL SULFATE HFA 90 MCG/ACTUATION AEROSOL INHALER
RESPIRATORY_TRACT | 2 refills | 0 days | Status: CP | PRN
Start: 2023-06-06 — End: 2024-06-05
  Filled 2023-06-07: qty 18, 17d supply, fill #0

## 2023-06-06 NOTE — Unmapped (Signed)
 Med refill

## 2023-06-06 NOTE — Unmapped (Addendum)
RN Follow Up     Refill request has already been responded to by other means       ----- Message from Kristy Garcia sent at 06/06/2023 10:50 AM EDT -----  The PAC has received an incoming call requesting medication refill/clarification/question:    Caller: [facility]  Best callback number: [phone]    Type of request (refill, clarification, question): refill  Name of medication: Kristy Garcia   Is there a preferred amount requested (e.g. 30 pills, 3 months worth - if no leave blank): 30 day  Desired pharmacy:   [facility]  [address]  [place] [address]  Ph. [phone] Fax# [phone]    Does caller request a callback from a nurse to discuss?:

## 2023-06-06 NOTE — Unmapped (Signed)
[facility] Tailored Care Management   Patient Outreach Note    Care Manager spoke with member and verified correct client to introduce the Tailored Care Management service through [facility]. Client is currently a patient at  [facility].    Discussed the following:  Program Services, Expectations of participation, and Verified Demographics    Client verbally consented to services and was enrolled in Tailored Care Management Service. We agreed to meet in person at her home on Monday, July 22 at 10 AM.     POS: This encounter was generated from the following place of service: 11 OFFICE .     Whitman Hero, MSW

## 2023-06-07 DIAGNOSIS — B20 Human immunodeficiency virus [HIV] disease: Principal | ICD-10-CM

## 2023-06-07 DIAGNOSIS — G894 Chronic pain syndrome: Principal | ICD-10-CM

## 2023-06-07 DIAGNOSIS — Z79899 Other long term (current) drug therapy: Principal | ICD-10-CM

## 2023-06-07 DIAGNOSIS — I10 Essential (primary) hypertension: Principal | ICD-10-CM

## 2023-06-07 DIAGNOSIS — J302 Other seasonal allergic rhinitis: Principal | ICD-10-CM

## 2023-06-07 LAB — HIV RNA, QUANTITATIVE, PCR: HIV RNA QNT RSLT: NOT DETECTED

## 2023-06-07 MED ORDER — BICTEGRAVIR 50 MG-EMTRICITABINE 200 MG-TENOFOVIR ALAFENAM 25 MG TABLET
ORAL_TABLET | Freq: Every day | ORAL | 11 refills | 30.00000 days | Status: CN
Start: 2023-06-07 — End: 2023-06-07
  Filled 2023-06-07: qty 30, 30d supply, fill #0

## 2023-06-07 MED ORDER — PREGABALIN 75 MG CAPSULE
ORAL_CAPSULE | Freq: Two times a day (BID) | ORAL | 3 refills | 90.00000 days | Status: CN
Start: 2023-06-07 — End: 2024-06-06

## 2023-06-07 MED ORDER — OLMESARTAN 20 MG TABLET
ORAL_TABLET | Freq: Every day | ORAL | 3 refills | 90.00000 days | Status: CN
Start: 2023-06-07 — End: 2024-06-06
  Filled 2023-06-07: qty 90, 90d supply, fill #0

## 2023-06-07 MED ORDER — FLUTICASONE PROPIONATE 50 MCG/ACTUATION NASAL SPRAY,SUSPENSION
Freq: Every day | NASAL | 3 refills | 120.00000 days | Status: CN
Start: 2023-06-07 — End: 2024-06-06
  Filled 2023-06-07: qty 16, 60d supply, fill #0

## 2023-06-07 MED ORDER — AMLODIPINE 10 MG TABLET
ORAL_TABLET | Freq: Every day | ORAL | 3 refills | 90.00000 days | Status: CN
Start: 2023-06-07 — End: 2023-06-14
  Filled 2023-06-07: qty 90, 90d supply, fill #0

## 2023-06-07 MED ORDER — LIDOCAINE 5 % TOPICAL PATCH
MEDICATED_PATCH | Freq: Two times a day (BID) | TRANSDERMAL | 5 refills | 15.00000 days | Status: CN
Start: 2023-06-07 — End: 2024-06-06

## 2023-06-08 MED ORDER — DULOXETINE 60 MG CAPSULE,DELAYED RELEASE
ORAL_CAPSULE | ORAL | 1 refills | 0 days
Start: 2023-06-08 — End: ?

## 2023-06-08 MED ORDER — QUETIAPINE 25 MG TABLET
ORAL_TABLET | 1 refills | 0 days
Start: 2023-06-08 — End: ?

## 2023-06-08 MED ORDER — TRAZODONE 50 MG TABLET
ORAL_TABLET | 1 refills | 0 days
Start: 2023-06-08 — End: ?

## 2023-06-10 NOTE — Unmapped (Signed)
[facility] Tailored Care Management Contact Note    Care Manager called member for the purposes of care coordination. Care Manager called to remind client that I will be visiting her on Monday at 10 AM to complete the assessment and obtain consents. Client  stated she may be at a different address on Monday and asked that I call before I leave to obtain correct address.    POS: This encounter was generated from the following place of service: 11 OFFICE .           Whitman Hero, MSW
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
## 2023-06-15 NOTE — Unmapped (Signed)
Addended by: Whitman Hero on: 06/15/2023 02:48 PM     Modules accepted: Orders

## 2023-06-20 ENCOUNTER — Ambulatory Visit: Admit: 2023-06-20 | Discharge: 2023-06-21 | Payer: PRIVATE HEALTH INSURANCE

## 2023-06-20 DIAGNOSIS — M25572 Pain in left ankle and joints of left foot: Principal | ICD-10-CM

## 2023-06-20 DIAGNOSIS — Z79899 Other long term (current) drug therapy: Principal | ICD-10-CM

## 2023-06-20 DIAGNOSIS — M7918 Myalgia, other site: Principal | ICD-10-CM

## 2023-06-20 DIAGNOSIS — M2550 Pain in unspecified joint: Principal | ICD-10-CM

## 2023-06-20 DIAGNOSIS — G894 Chronic pain syndrome: Principal | ICD-10-CM

## 2023-06-20 DIAGNOSIS — G8929 Other chronic pain: Principal | ICD-10-CM

## 2023-06-20 LAB — C-REACTIVE PROTEIN: C-REACTIVE PROTEIN: 10 mg/L (ref <=10.0)

## 2023-06-20 LAB — RHEUMATOID FACTOR, QUANT: RHEUMATOID FACTOR: 3.6 [IU]/mL (ref <14.0)

## 2023-06-20 LAB — SEDIMENTATION RATE: ERYTHROCYTE SEDIMENTATION RATE: 18 mm/h (ref 0–30)

## 2023-06-20 MED ORDER — LACOSAMIDE 50 MG TABLET
ORAL_TABLET | Freq: Two times a day (BID) | ORAL | 2 refills | 30 days | Status: CP
Start: 2023-06-20 — End: 2023-06-20

## 2023-06-20 MED ORDER — PREGABALIN 75 MG CAPSULE
ORAL_CAPSULE | Freq: Two times a day (BID) | ORAL | 0 refills | 90 days | Status: CP
Start: 2023-06-20 — End: 2023-06-20
  Filled 2023-09-12: qty 180, 90d supply, fill #0

## 2023-06-20 MED ORDER — GABAPENTIN 300 MG CAPSULE
ORAL_CAPSULE | Freq: Every evening | ORAL | 1 refills | 135 days | Status: CP
Start: 2023-06-20 — End: 2024-06-19
  Filled 2023-09-12: qty 180, 90d supply, fill #0

## 2023-06-20 MED ADMIN — methylPREDNISolone sodium succinate (SOLU-Medrol) injection 40 mg: 40 mg | INTRAMUSCULAR | @ 13:00:00 | Stop: 2023-06-20 | NDC 70121157305

## 2023-06-20 NOTE — Unmapped (Signed)
States that she took her night time meds last night

## 2023-06-20 NOTE — Unmapped (Signed)
CHRONIC PAIN RETURN VISIT  Adron Bene, FNP      Date: June 20, 2023  Patient Name: Kristy Garcia  MRN: [id number]  PCP: Artelia Laroche    Assessment:     1. Arthralgia of multiple sites, bilateral    2. Myalgia, multiple sites    3. Encounter for long-term (current) use of high-risk medication    4. Chronic pain syndrome    5. Chronic pain of left ankle       Provider Remarks: [name] is a 57 y.o. female with a PMH of cervical retrolisthesis (C5/C6) and facet arthropathy, polysubstance use disorder, depression, and anxiety who presented to [facility] for widespread, bilateral arthralgia and myalgia.  She has family history of RA and we draw labs to rule out inflammatory/rheumatologic pathology.  We also administer IM solumedrol and start lacosamide to address polyneuropathy. She will message me on MyChart after one week of treatment and we will escalate dose to 100mg  BID if well tolerated.    - Patient's pain is not controlled.  - Benefits and risks of ongoing treatment reviewed.   - Benefits>risks    PLAN  - Sedimentation rate, manual; Future  - C-reactive protein; Future  - Rheumatoid Factor, Quantitative; Future  - Anti-Nuclear Antibody (ANA); Future  - ADMINISTER methylPREDNISolone sodium succinate (SOLU-Medrol) injection 40 mg  - START lacosamide (VIMPAT) 50 mg Tab; Take 1 tablet (50 mg total) by mouth two (2) times a day. Start with 1 tablet at night for 3 days.  Then increase to 1 tablet twice daily.  Message provider after 1 week.  Dispense: 60 tablet; Refill: 2  - Continue gabapentin (NEURONTIN) 300 MG capsule; Take 2 capsules (600 mg total) by mouth at bedtime.  Dispense: 270 capsule; Refill: 1  - Continue pregabalin (LYRICA) 75 MG capsule; Take 1 capsule (75 mg total) by mouth two (2) times a day.  Dispense: 180 capsule; Refill: 0  - Follow-up in this clinic in 12 weeks or sooner should s/s worsen or fail to improve    I personally spent 45 minutes face-to-face and non-face-to-face in the care of this patient, which includes all pre, intra, and post visit time on the date of service.  All documented time was specific to the E/M visit and does not include any procedures that may have been performed.    Subjective:     REASON FOR VISIT:  Management of chronic pain and monitoring of medication safety.    HPI:  Kristy Garcia is a 57 y.o. female presents to [facility] for for evaluation and recommendations regarding widespread arthralgia and myalgia which is bilateral and ongoing for 1 year.  Her personal history is significant for positive thyroid peroxidase antibody, positive ANA.  Family history is significant for mother with RA.    She shares,     I hurt everywhere.  I've got joint pain in my ankles, knees, and hands, and my muscles and bones hurt.  I feel like I need a muscle relaxer before and that helped. I can't sit for too long because everything gets stiff.  I have had pain for a while but it's been really bad for one year.  I have lost 5 pounds over the last 6 months.       AT LAST PAIN VISIT:  -  levetiracetam was started  - diclofenac gel was started     SINCE LAST PAIN VISIT:   -  Pain is worse  - She was not able to tolerate levetiracetam due s/e headache  - Mood has been stable      PAIN GENERATOR 1: Ankles (bilateral), feet (bilateral), knees (bilateral)  Character:  throbbing, stabbing, ripping, painful  Distribution:  Moves upward in a continuous line from bilateral ankles and feel to bilateral knees, hips, shoulders, and neck  Frequency:  constant  - not associated with weakness, tingling, numbness    PAIN GENERATOR 2: Elbows (bilateral), hands (bilateral), MCP joints (bilateral)  Character:  painful  Frequency:  constant    GOALS: include: complete resolution of the pain and no medications and become more active with the family.  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    - Lyrica, 75 mg at night  - Duloxetine, 60mg , daily  - Celecoxib, 100mg , BID    Patient reports she is taking medications as prescribed.  Patient reports poor overall analgesia from the current medication regimen.  Patient reports that the current medications do help to improve the quality of life and level of daily function.  Patient reports being able to tolerate the current pain medications well.  Patient reports being able to perform the majority of ADLs on the current medication regimen.'    Benefits of Medications:  - Improved function with IADL's  - Improved sleep  - Improved mood    Risks of Medications:  - Overdose  - Death  - Constipation  - Addiction  - Tolerance  - Constipation  - Worsening of chronic pain condition over time    Adverse Effects of Analgesic Medications:  Constipation: NO  Sedation: NO (not with current dosing schedule)  Cognitive impairment: NO  Falls: NO    Utox:  - 01/04/2023 appropriate    Liver function: 06/03/23 SGOT (AST): 17  ALT: 15    Kidney function: eGFR Cre: 86 at 06/03/2023  2:44 PM  Calculated from:  Serum Creatinine: 0.80 mg/dL at 0/86/5784  6:96 PM  Age: 16 years  Sex: Female at 06/03/2023  2:44 PM  Calculated using the CKD-EPI Creatinine Equation (2021)    Patient did bring prescription bottles for pill/patch counts today and was advise of our new policy to bring pills/patches to all appointments, even if empty.    [place] Controlled Substance Reporting System was reviewed for this patient today.  Current MME= 0    ROS    Review of Systems   Constitutional:  Positive for malaise/fatigue and weight loss. Negative for chills and fever.        5 pounds over the last 6 months   HENT: Negative.     Eyes: Negative.    Respiratory: Negative.     Cardiovascular: Negative.    Gastrointestinal: Negative.    Genitourinary: Negative.    Musculoskeletal:  Positive for joint pain, myalgias and neck pain.   Skin:  Negative for itching and rash.   Neurological: Negative.    Psychiatric/Behavioral: Negative.       The review of systems is negative except what was stated elsewhere in th echarty.     Objective:     Vitals:    06/20/23 0804   Weight: 70.3 kg (155 lb)  PHYSICAL EXAM:    Physical Exam  Constitutional:       General: She is not in acute distress.     Appearance: She is normal weight. She is not ill-appearing.   Eyes:      Conjunctiva/sclera: Conjunctivae normal.   Neck:      Comments: Left trapezius hypertrophic compared with right  Pulmonary:      Effort: Pulmonary effort is normal.   Musculoskeletal:      Right shoulder: No swelling or tenderness. Normal range of motion.      Left shoulder: No swelling or tenderness. Normal range of motion.      Right upper arm: No swelling, edema or tenderness.      Left upper arm: No swelling, edema or tenderness.      Right elbow: Normal range of motion. No tenderness.      Left elbow: Normal range of motion. No tenderness.      Right forearm: No tenderness.      Left forearm: No tenderness.      Right wrist: No swelling or bony tenderness.      Left wrist: No swelling or bony tenderness.      Right hand: No swelling.      Left hand: No swelling or deformity.      Cervical back: No tenderness or bony tenderness. Decreased range of motion.      Thoracic back: No spasms or bony tenderness. Normal range of motion.      Lumbar back: No spasms, tenderness or bony tenderness.      Right lower leg: No swelling, tenderness or bony tenderness. No edema.      Left lower leg: No swelling, tenderness or bony tenderness. No edema.      Right ankle: Normal. No swelling. Normal range of motion.      Left ankle: Normal. No swelling. Normal range of motion.      Right foot: No swelling.      Left foot: No swelling.      Comments: Decreased lateral rotation of neck to left   Skin:     General: Skin is warm and dry.   Neurological:      General: No focal deficit present.      Mental Status: She is oriented to person, place, and time.      Motor: No weakness.      Coordination: Coordination normal.      Gait: Gait normal.      Comments: She ambulates through the clinic without difficulty and without use of a walking aid.  Gait is steady and even.  Posture upright.   Psychiatric:         Mood and Affect: Mood normal.         Behavior: Behavior normal.         Thought Content: Thought content normal.         Judgment: Judgment normal.        Records Review:

## 2023-06-22 LAB — ANA: ANTINUCLEAR ANTIBODIES (ANA): POSITIVE — AB

## 2023-06-27 MED ORDER — AZITHROMYCIN 250 MG TABLET
ORAL_TABLET | 10 refills | 0 days
Start: 2023-06-27 — End: ?

## 2023-06-28 DIAGNOSIS — B20 Human immunodeficiency virus [HIV] disease: Principal | ICD-10-CM

## 2023-06-28 DIAGNOSIS — F1021 Alcohol dependence, in remission: Principal | ICD-10-CM

## 2023-06-28 DIAGNOSIS — Z79899 Other long term (current) drug therapy: Principal | ICD-10-CM

## 2023-06-28 MED ORDER — BIKTARVY 50 MG-200 MG-25 MG TABLET
ORAL_TABLET | Freq: Every day | ORAL | 0 refills | 30 days
Start: 2023-06-28 — End: ?

## 2023-06-28 NOTE — Unmapped (Signed)
[facility] Tailored Care Management Contact Note    Care Manager called member for the purposes of individual and/or family support. Client  stated she saw her doctor and is taking a new medication for pain and a new medication for her depression and both are working well and she's already noticed a difference. Member stated that she needs to find another place to live because her friend's son is moving back in and there won't be room. Care Manager shared that housing is challenging right now and asked if she had other places she could stay. Member stated she was planning to call [facility] to see if they have availability because she has been there before. Care Manager asked if she was currently using and she said I was drinking last week and am currently drinking now. Member noted that she has two appointments on Thursday but is considering rescheduling due to inclement weather and increased covid cases. Care Manager offered to check back next week to see if she needs any assistance with her current situation.    POS: This encounter was generated from the following place of service: 11 OFFICE .           Whitman Hero, MSW

## 2023-06-29 MED ORDER — BIKTARVY 50 MG-200 MG-25 MG TABLET
ORAL_TABLET | Freq: Every day | ORAL | 10 refills | 30 days | Status: CP
Start: 2023-06-29 — End: ?
  Filled 2023-07-06: qty 30, 30d supply, fill #0

## 2023-06-29 MED ORDER — AZITHROMYCIN 250 MG TABLET
ORAL_TABLET | 10 refills | 0 days
Start: 2023-06-29 — End: ?

## 2023-06-29 NOTE — Unmapped (Signed)
Medication refill request  

## 2023-07-04 DIAGNOSIS — F3341 Major depressive disorder, recurrent, in partial remission: Principal | ICD-10-CM

## 2023-07-04 DIAGNOSIS — G894 Chronic pain syndrome: Principal | ICD-10-CM

## 2023-07-04 MED ORDER — LIDOCAINE 5 % TOPICAL PATCH
MEDICATED_PATCH | Freq: Two times a day (BID) | TRANSDERMAL | 0 refills | 30 days | Status: CP
Start: 2023-07-04 — End: ?
  Filled 2023-07-07: qty 30, 30d supply, fill #0

## 2023-07-04 NOTE — Unmapped (Signed)
[facility] Refill Coordination Note    Specialty Medication(s) to be Shipped:   Infectious Disease: Biktarvy    Other medication(s) to be shipped:  Ventolin, celecoxib, duloxetine, pravastatin, lidocaine patch, quetiapine, trazodone     Kristy Garcia, DOB: 21-Dec-1965  Phone: There are no phone Garcia on file.      All above HIPAA information was verified with patient.     Was a Nurse, learning disability used for this call? No    Completed refill call assessment today to schedule patient's medication shipment from the [facility] [phone]).  All relevant notes have been reviewed.     Specialty medication(s) and dose(s) confirmed: Regimen is correct and unchanged.   Changes to medications: Kristy Garcia reports no changes at this time.  Changes to insurance: No  New side effects reported not previously addressed with a pharmacist or physician: None reported  Questions for the pharmacist: No    Confirmed patient received a Conservation officer, historic buildings and a Surveyor, mining with first shipment. The patient will receive a drug information handout for each medication shipped and additional FDA Medication Guides as required.       DISEASE/MEDICATION-SPECIFIC INFORMATION        N/A    SPECIALTY MEDICATION ADHERENCE     Medication Adherence    Patient reported X missed doses in the last month: 0  Specialty Medication: BIKTARVY 50-200-25 mg tablet (bictegrav-emtricit-tenofov ala)  Patient is on additional specialty medications: No  Patient is on more than two specialty medications: No  Any gaps in refill history greater than 2 weeks in the last 3 months: no  Demonstrates understanding of importance of adherence: yes  Informant: patient  Confirmed plan for next specialty medication refill: delivery by pharmacy  Refills needed for supportive medications: not needed          Refill Coordination    Has the Patients' Contact Information Changed: No  Is the Shipping Address Different: No         Were doses missed due to medication being on hold? No    BIKTARVY 50-200-25   mg: 3 days of medicine on hand       REFERRAL TO PHARMACIST     Referral to the pharmacist: Not needed      [facility]     Shipping address confirmed in Epic.       Delivery Scheduled: Yes, Expected medication delivery date: 07/07/2023.     Medication will be delivered via Next Day Courier to the temporary address in Epic WAM.    Kerby Less   [facility] Specialty Technician

## 2023-07-04 NOTE — Unmapped (Signed)
[facility] Tailored Care Management Contact Note    Care Manager called member for the purposes of care coordination and individual and/or family support. Client  reports that her peer support helped her apply to [facility] for housing and she was accepted. Member reports that she will need her doctor to fill out some forms and submit it to them and she requested assistance with alerting her doctor about this. Care Manager sent an Epic chat to Dr. Marguerita Beards to share the details of this request and informed her member was planning to drop them off at her office on Thursday, following her nutrition appointment. Care Manager shared her doctor's phone number so she could also follow up with her regarding this request. 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    POS: This encounter was generated from the following place of service: 11 OFFICE .           Whitman Hero, MSW

## 2023-07-05 DIAGNOSIS — G8929 Other chronic pain: Principal | ICD-10-CM

## 2023-07-05 DIAGNOSIS — G894 Chronic pain syndrome: Principal | ICD-10-CM

## 2023-07-06 MED FILL — QUETIAPINE 25 MG TABLET: 90 days supply | Qty: 90 | Fill #0

## 2023-07-06 MED FILL — VENTOLIN HFA 90 MCG/ACTUATION AEROSOL INHALER: RESPIRATORY_TRACT | 17 days supply | Qty: 18 | Fill #1

## 2023-07-06 NOTE — Unmapped (Signed)
Kristy Garcia, CMA, called patient to verify pharmacy. Since our office received an Rx request from [facility] for [facility], Rx was originally send to [facility] pharmacy on 06/29/23 with 11 refills. Per patient will like to keep Rx at [facility].

## 2023-07-07 ENCOUNTER — Ambulatory Visit
Admit: 2023-07-07 | Discharge: 2023-07-07 | Payer: PRIVATE HEALTH INSURANCE | Attending: Registered" | Primary: Registered"

## 2023-07-07 ENCOUNTER — Ambulatory Visit: Admit: 2023-07-07 | Discharge: 2023-07-07 | Payer: PRIVATE HEALTH INSURANCE

## 2023-07-07 DIAGNOSIS — R079 Chest pain, unspecified: Principal | ICD-10-CM

## 2023-07-07 DIAGNOSIS — R600 Localized edema: Principal | ICD-10-CM

## 2023-07-07 NOTE — Unmapped (Signed)
It was a pleasure speaking with you.  As discussed  -     Replace pork products with [place], chicken, fish or beans  Replace salty seasoning with Mrs. Dash  Once you live in a steady place, work towards walking 30 minutes daily    Please call [phone] to schedule a follow up appointment if needed.

## 2023-07-07 NOTE — Unmapped (Signed)
[facility]   Medical Nutrition Therapy Consultation          Visit Type:    Initial Assessment    Referring Provider:    Neal Dy*     Referral Diagnosis:   1. Mixed hyperlipidemia        Japneet Kolene Kristy Garcia is a 57 y.o. female seen for medical nutrition therapy for hyperlipidemia. Her active problem list, medication list, allergies, family history, social history, health maintenance, notes from last several encounters, and lab results were reviewed.     Her interim medical history is significant for:  Tobacco use disorder  Alcohol use disorder, moderate, in sustained remission   Mild lumbar degenerative disc disease  Chronic pain syndrome   Cocaine use disorder, mild, in sustained remission, abuse    Essential hypertension   HIV (human immunodeficiency virus infection)   Hyperlipidemia   Hyperthyroidism  Myalgia, multiple sites  Osteopenia  Primary osteoarthritis of both hands  Recurrent major depressive disorder  Sciatica of right side  Suicide and self-inflicted injury     Anthropometrics   Height: 162.6 cm (5' 4)   Weight: 72.6 kg (160 lb)  Body mass index is 27.46 kg/m??.    Wt Readings from Last 12 Encounters:   07/07/23 72.3 kg (159 lb 6.4 oz)   07/07/23 72.6 kg (160 lb)   06/20/23 70.3 kg (155 lb)   06/03/23 70.8 kg (156 lb)   04/27/23 70.5 kg (155 lb 8 oz)   04/27/23 70 kg (154 lb 6.4 oz)   03/14/23 72.5 kg (159 lb 12.8 oz)   01/26/23 72.7 kg (160 lb 3.2 oz)   01/20/23 70.8 kg (156 lb 1.6 oz)   01/05/23 71.8 kg (158 lb 4.8 oz)   12/31/22 71.7 kg (158 lb 1.1 oz)   12/08/22 71.7 kg (158 lb)        Usual body weight (lbs): 160 (current weight)    Patient reported highest weight (lbs) : 160  Patient reported lowest weight (lbs): 150  Ideal Body Weight: 54.49 kg  Weight in (lb) to have BMI = 25: 145.3    Nutrition Risk Screening:   Food Insecurity: Food Insecurity Present (06/15/2023)    Hunger Vital Sign     Worried About Radiation protection practitioner of Food in the Last Year: Sometimes true     Ran Out of Food in the Last Year: Never true          Nutrition Evaluation  Overall Impressions: Nutrition-Focused Physical Exam not indicated due to lack of malnutrition risk factors. (07/07/23 1220)  Nutrition Designation: Overweight (BMI 25.00 - 29.99 kg/m2) (07/07/23 1220)     Malnutrition Screening:   Patient does not meet AND/ASPEN criteria for malnutrition at this time (07/07/23 1224)    Biochemical Data, Medical Tests and Procedures:  All pertinent labs and imaging reviewed by Baird Cancer, RD/LDN at 12:25 PM 07/07/2023.    Lab Results   Component Value Date    NA 137 06/03/2023    CL 105 06/03/2023    BUN 11 06/03/2023    CREATININE 0.80 06/03/2023    BCR 14 06/03/2023    K 4.0 06/03/2023    GLU 91 06/03/2023    TRIG 192 (H) 01/23/2018    CHOL 152 01/23/2018    HDL 51 01/23/2018    LDL 63 01/23/2018    A1C 5.2 10/30/2020       Medications and Vitamin/Mineral Supplementation:   All nutritionally pertinent medications and  supplements reviewed on 07/07/2023.     Current Outpatient Medications   Medication Sig Dispense Refill    acetaminophen (TYLENOL 8 HOUR) 650 MG CR tablet Take 2 tablets (1,300 mg total) by mouth in the morning.      albuterol HFA 90 mcg/actuation inhaler Inhale 2 puffs every four (4) hours as needed for wheezing. 18 g 2    amlodipine (NORVASC) 10 MG tablet Take 1 tablet (10 mg total) by mouth daily. 90 tablet 0    aripiprazole (ABILIFY) 2 MG tablet Take 1 tablet (2 mg total) by mouth every morning.      B-complex with vitamin C (TOTAL B W/C) tablet Take 1 tablet by mouth daily. 30 tablet 12    bictegrav-emtricit-tenofov ala (BIKTARVY) 50-200-25 mg tablet Take 1 tablet by mouth daily. 30 tablet 10    budesonide-formoterol (SYMBICORT) 80-4.5 mcg/actuation inhaler Inhale 2 puffs two (2) times a day. 6.9 g 5    celecoxib (CELEBREX) 100 MG capsule Take 1 capsule (100 mg total) by mouth two (2) times a day. 60 capsule 5    diclofenac sodium (VOLTAREN) 1 % gel Apply 2 g topically four (4) times a day. 450 g 5    DULoxetine (CYMBALTA) 60 MG capsule Take 1 capsule (60 mg total) by mouth daily. 90 capsule 3    DULoxetine (CYMBALTA) 60 MG capsule Take 1 capsule every day by oral route at bedtime for 90 days. 90 capsule 1    fluticasone propionate (FLONASE) 50 mcg/actuation nasal spray Instill 1 spray into each nostril daily. 16 g 0    gabapentin (NEURONTIN) 300 MG capsule Take 2 capsules (600 mg total) by mouth at bedtime. 270 capsule 1    lacosamide (VIMPAT) 50 mg Tab Take 1 tablet (50 mg total) by mouth two (2) times a day. Start with 1 tablet at night for 3 days.  Then increase to 1 tablet twice daily.  Message provider after 1 week. 60 tablet 2    lidocaine (LIDODERM) 5 % patch Apply 1 patch to affected area for 12 hours only each day (then remove patch) 60 patch 0    olmesartan (BENICAR) 20 MG tablet Take 1 tablet (20 mg total) by mouth daily. 90 tablet 0    pravastatin (PRAVACHOL) 40 MG tablet Take 1 tablet (40 mg total) by mouth daily. 90 tablet 3    pregabalin (LYRICA) 75 MG capsule Take 1 capsule (75 mg total) by mouth two (2) times a day. 180 capsule 0    QUEtiapine (SEROQUEL) 25 MG tablet Take 1 full tablet by mouth nightly. 90 tablet 1    tiotropium (SPIRIVA) 18 mcg inhalation capsule Place 1 capsule (18 mcg total) into inhaler and inhale daily. 30 capsule 11    traZODone (DESYREL) 50 MG tablet Take 1 tablet (50 mg total) by mouth nightly. 90 tablet 3    traZODone (DESYREL) 50 MG tablet Take 1-2 tablets by mouth as needed for sleep. 180 tablet 1     No current facility-administered medications for this visit.       Social Nutrition History:     Household Includes: Getting ready to go into transitional housing - long term - until gets disability (has a Surveyor, mining)    Producer, television/film/video and Access: Uses food stamps - reports it covers the basics    GI Nutrition History:    Dietary Restrictions / Allergies / GI issues: Constipation since taking a new pain medication    Diet Recall: Time Intake   Breakfast  Dry Cereal   Snack (AM)    Lunch Peas and mashed potatoes  Piece of fried chicken   Snack (PM)    Dinner Chicken noodle soup  Crackers   Snack (HS)      Food-Related History:  Snacks:  Applesauce (canned or fresh fruit), fresh vegetables (tomato, cucumber, carrot)  Beverages:  Water, Ginger ale, Coffee, Energy drinks, Juice  Dining Out:  I can't afford to eat out  Usual Food Choices: Fried chicken (homemade in a deep fryer) 2x/month, Soup, Tossed Salads, Baloney Sandwich with lettuce and tomato, Sausage patties.  Pork chops, pork roast, pork ribs, pigs feet  Meal Schedule:  Only eat breakfast if taking pills early.  Usually has at least 2 meals per day.     Eating Behaviors:  Hunger and Satiety: Gets hungry, then eats a big meal  Overeating: Reports not a problem  Emotional Eating: Reports not a problem  Snacking: Reports not a problem  Alcohol: A couple of drinks in the past month    Lifestyle Activity:  Daily activity includes: Taking college classes and studying  (full time)  Usual exercise: Takes the stairs frequently.    Physical Activity:  Physical activity level is sedentary with little to no exercise.     Daily Estimated Nutritional Needs:  Energy: ~1550 for weight maintenance, ~1300 for weight loss of 2 lbs per month kcals [Per [place]-Jeor Equation using last recorded weight, 73 kg (07/07/23 1220)]  Protein: 73 - 88 gm [1.0-1.2 gm/kg using last recorded weight, 73 kg (07/07/23 1220)]  Carbohydrate:   [no restriction]  Fluid: ~1500 - 2000 [1 mL/kcal ]  Sodium:  <2000 mg     Nutrition Assessment       Ms. Agate shares she  has followed a heart healthy eating pattern her whole life because that is what she enjoys.  She is attending the visit on the recommendation of her PCP after having her cholesterol level checked, however I do not see any documentation of the lab value in her EPIC chart.   Her current eating pattern includes ~ 0-4 servings of fruits and vegetables daily and adequate amounts of high protein foods.  Her access to food varies based on her living situation, and she is awaiting transitional housing.   Ms. Gombert biggest nutrition concerns at this time are frequent consumption of pork and salty seasonings.  Discussion focused on how to track and monitor sodium intake, as well as alternatives to pork.  Balanced meal planning was also reviewed.    Nutrition Goals & Evaluation      Patient Stated Long-term Goals:   Eat healthier to come off medications if possible (NEW)  2.   Get cholesterol and blood pressure in normal range (NEW)    Patient Stated Goal for Nutrition Visit:  Learn what eating pattern will help me meet my health goals (MET)    Patient Stated Behavioral Goals:   Replace pork products with [place], chicken, fish or beans  Replace salty seasoning with Mrs. Dash  Once you live in a steady place, work towards walking 30 minutes daily    The patient expressed good motivation to follow the above recommendations.        Nutrition Intervention      Individualized nutrition counseling provided including motivational interviewing, problem solving, goal setting for meeting recommendations and/or individual goals.       Educational Materials Provided:  MyPlate Eating Method with Sample Meal Plans (07/07/23)  The Mediterranean Plate (1/61/09)  Guide to Tracking Sodium (07/07/23)      Follow up to be scheduled by the patient if needed.      Effectiveness of interventions and patient progress toward stated goals will be assessed at time of follow-up.       Recommendations for care team:  Encourage patient to follow nutrition recommendations     Ares Y. Suminski's referral order and comments were reviewed on 07/07/2023.She has an active referral order.     Time spent 45 minutes   I am located on-site and the patient is located on-site for this visit.

## 2023-07-07 NOTE — Unmapped (Unsigned)
Internal Medicine Clinic Visit    Reason for visit: swelling in hands/feet    A/P:         1. Chest pain, unspecified type    2. Peripheral edema      Chest Pain - Peripheral Edema  - Patient reports chest discomfort which is not new, but that has been more prominent over the last few weeks. Not anginal sounding. Never notices it while active, but when sitting down. 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 EKG obtained today wnl and c/w prior. No evidence of acute ischemia. Patient was seen recently in clinic for cough, which has now improved significantly, but query whether chest pain represents chostochondritis.  - Patient with new edema in hands/feet over the last week. Occurs daily but improves/resolves at night. Questionable orthopnea. No recent medication additions other than Vimpat. Longstanding amlodipine use. No recent increase in gabapentin. Minimal appreciable edema on exam today. Recent TSH, LFTs, UACR WNL.  - given new peripheral edema, questionable orthopnea, recent chest pain, will obtain ECHO    Unhoused - Social Determinants of Health  - FL2 completed today     No follow-ups on file.    Staffed with Dr. Guinevere Ferrari, discussed    __________________________________________________________    HPI:    Pt is a 57 y.o. female with a history of HTN, depression, chronic pain syndrome, tobacco use who presents today in follow-up.    Patient reports swelling in feet, ankles, and hands since last week. Occurs daily. Improves with elevation. No recent fevers, chills. Hydrated. Eating okay. Was having chest pain. Weeks long hx but hasn't bothered her as much in the past. 5-6/10. Radiates from side to side. Reproducible on exam. Doesn't occur with exertion, notices it when still.     No hx of kidney or liver disease. 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 PET MPI in 2019 without significant coronary calcifications. ECHO relatively normal in 2019  __________________________________________________________        Medications: Reviewed in EPIC  __________________________________________________________    Physical Exam:   Vital Signs:  Vitals:    07/07/23 1044   BP: 126/66   BP Site: L Arm   BP Position: Sitting   BP Cuff Size: Large   Pulse: 66   Resp: 17   Temp: 36.2 ??C (97.1 ??F)   TempSrc: Temporal   SpO2: 99%   Weight: 72.3 kg (159 lb 6.4 oz)   Height: 162.6 cm (5' 4)          PTHomeBP    The patient???s Average Home Blood Pressure during the last two weeks is :   /   based on  readings    Gen: Well appearing, NAD  CV: RRR, no S3, S4, no murmurs  Pulm: CTA bilaterally, no crackles or wheezes  Abd: Soft, NTND, normal BS.   Ext: Trace edema in ankles  Psych: Appropriate mood and affect    PHQ-9 Score:     GAD-7 Score:       Medication adherence and barriers to the treatment plan have been addressed. Opportunities to optimize healthy behaviors have been discussed. Patient / caregiver voiced understanding. - Pain  She follows with [facility] pain clinic and was recently started on Keppra for management of chronic pain. She notes that she did not tolerate Keppra with immediate headache. She discontinued Keppra and continues on pregabalin and gabapentin, as well as celebrex, and feels that her pain is very well managed on this regimen.      -  Breathing  Today, her primary complaint is dry cough with shortness of breath over the past 3 weeks. She has a history of using albuterol inhalers for presumed asthma, but notes that albuterol has not been helping. She has used a friend's symbicort inhaler with good relief. She denies any fevers.      - Smoking  Currently smoking 1/2 ppd.      - Social Drivers of Health  Danaja reports that she is currently living on her aunt's couch, waiting to care for her aunt who is currently in the hospital. She is unsure at present her plans for housing in the future. Uses medicaid transportation to get to doctors appointments.   __________________________________________________________        Medications:  Reviewed in EPIC  __________________________________________________________    Physical Exam:   Vital Signs:  Vitals:    07/07/23 1044   BP: 126/66   BP Site: L Arm   BP Position: Sitting   BP Cuff Size: Large   Pulse: 66   Resp: 17   Temp: 36.2 ??C (97.1 ??F)   TempSrc: Temporal   SpO2: 99%   Weight: 72.3 kg (159 lb 6.4 oz)   Height: 162.6 cm (5' 4)          PTHomeBP    The patient???s Average Home Blood Pressure during the last two weeks is :   /   based on  readings            Gen: Well appearing, NAD  CV: RRR, no murmurs  Pulm: CTA bilaterally, no crackles or wheezes  Abd: Soft, NTND, normal BS.   Ext: No edema  ***    PHQ-9 Score:     GAD-7 Score:       Medication adherence and barriers to the treatment plan have been addressed. Opportunities to optimize healthy behaviors have been discussed. Patient / caregiver voiced understanding.

## 2023-07-07 NOTE — Unmapped (Signed)
[place] [facility] at [facility]     Type of visit: face to face    Are you located in [place]? (for virtual visits only) N/A    Reason for visit: Follow up    Questions / Concerns that need to be addressed: Hand and feet swelling for a week. Been having chest pain off and on lately.     Screening BP- 126/66 P 66    [employer]ron BPs (complete if screening BP has a systolic  > 130 or diastolic > 80)  BP#1    BP#2   BP#3     Average BP   (please note this as a comment in vitals)     PTHomeBP     Diabetes:  Regularly checking blood sugars?: no  If yes, when? Complete log for past 7 days  Date Before Breakfast After Breakfast Before Lunch After Lunch Before Dinner After Dinner Before Bed                                                                                                                                   HCDM reviewed and updated in Epic:    We are working to make sure all of our patients??? wishes are updated in Epic and part of that is documenting a Environmental health practitioner for each patient  A Health Care Decision Maker is someone you choose who can make health care decisions for you if you are not able - who would you most want to do this for you????  is already up to date.    HCDM (patient stated preference): Riley,Patricia - Mother - [phone]    BPAs completed:  N/A     COVID-19 Vaccine Summary  Which COVID-19 Vaccine was administered  Moderna  Type:  Dates Given:  09/28/2022                   If no: Are you interested in scheduling? Declines vaccine    Immunization History   Administered Date(s) Administered    COVID-19 VACCINE,MRNA(MODERNA)(PF) 11/22/2019, 12/20/2019, 07/31/2020, 03/25/2021    Covid-19 Vac, (71yr+) (Spikevax) Monovalent Xbb.1.5 Moder  09/28/2022    HEPATITIS B VACCINE ADULT, ADJUVANTED, IM(HEPLISAV B) 02/26/2022, 07/30/2022    Hep A / Hep B 12/13/2017, 03/15/2018, 08/09/2018    INFLUENZA TIV (TRI) PF (IM) 10/25/2013    Influenza Vaccine Quad(IM)6 MO-Adult(PF) 08/31/2012, 08/15/2014, 08/07/2015, 10/11/2016, 09/06/2017, 08/09/2018, 08/08/2019, 10/30/2020, 02/26/2022, 07/30/2022    Influenza Virus Vaccine, unspecified formulation 02/26/2022, 07/30/2022    MENINGOCOCCAL VACCINE, A,C,Y, W-135(IM)(MENVEO) 12/31/2022, 06/03/2023    PNEUMOCOCCAL POLYSACCHARIDE 23-VALENT 10/25/2013, 08/07/2015    Pneumococcal Conjugate 13-Valent 02/01/2013    Pneumococcal Conjugate 20-valent 12/31/2022    SHINGRIX-ZOSTER VACCINE (HZV),RECOMBINANT,ADJUVANTED(IM) 06/03/2023    TdaP 05/03/2016       __________________________________________________________________________________________    SCREENINGS COMPLETED IN FLOWSHEETS    HARK Screening       AUDIT  PHQ2       PHQ9          P4 Suicidality Screener                GAD7       COPD Assessment       Falls Risk

## 2023-07-07 NOTE — Unmapped (Addendum)
It was a pleasure to see you in clinic today  Your EKG and exam were reassuring   We've ordered an ECHO which they should call you to schedule to evaluate your heart function.  Continue your medications as prescribed at current  Follow-up with Dr. Brooke Dare on 07/27/23 as previously scheduled

## 2023-07-10 NOTE — Unmapped (Signed)
Immediately after or during the visit, I reviewed with the resident the medical history and the resident???s findings on physical examination.  I discussed with the resident the patient???s diagnosis and concur with the treatment plan as documented in the resident note. Nahzir Pohle M Kele Withem, MD

## 2023-07-19 ENCOUNTER — Ambulatory Visit: Admit: 2023-07-19 | Discharge: 2023-07-20 | Payer: PRIVATE HEALTH INSURANCE

## 2023-07-21 DIAGNOSIS — Z006 Encounter for examination for normal comparison and control in clinical research program: Principal | ICD-10-CM

## 2023-07-22 ENCOUNTER — Institutional Professional Consult (permissible substitution): Admit: 2023-07-22 | Discharge: 2023-07-23

## 2023-07-22 DIAGNOSIS — Z006 Encounter for examination for normal comparison and control in clinical research program: Principal | ICD-10-CM

## 2023-07-22 MED ADMIN — acetaminophen (TYLENOL) tablet 650 mg: 650 mg | ORAL | @ 15:00:00 | Stop: 2023-07-22

## 2023-07-22 NOTE — Unmapped (Addendum)
Pt arrived to [facility] for study protocol # [id number], alert, oriented and ambulatory.   Pt met with study coordinator, Clarita Crane.   Neuropsych testing performed by study team.   Pt consented to the study.   Weight and height obtained.   Stable vital signs obtained. Pt verbalized pain 0/10 prior to LP procedure. Pt stated she had a headache attributed to eye strain. Provider Patrice Paradise PA aware of pt's headache.  Kit labs drawn via peripheral venipuncture per order.   Offered snacks to pt by study team.  LP procedure performed by provider.   Stable vital signs obtained at 15 minutes and 30 minutes post LP procedure.   Pain levels assessed at 15 minutes, 30 minutes and 60 minutes post LP procedure.   Pt continued to complain of headache, but stated headache didn't worsen post LP procedure. Pt continued to verbalized pain 0/10 post LP procedure. Provider notified. And Tylenol 650 mg PO administered per order.  Headache subsided at 1150.   No additional monitoring required per provider.   Care assumed to study team.   Pt discharged from [facility] without complaint voiced or noted.

## 2023-07-22 NOTE — Unmapped (Signed)
Ms. Yamane presents for their lumbar puncture (LP) study visit.    During the consent process I explained in detail the LP procedure and risks.      The following risks, and ways to minimize them, were reviewed and discussed:    Backache, shooting pain down the leg(s) during the procedure, bleeding, headache, persist headache lasting > 5 days not responding to NSAIDs, infection, CSF leak, and brain herniation.    The subject had the opportunity to ask questions and have them answered to their satisfaction prior to the LP.  Procedure consent signed by participant today.     Participant was placed in a seated position on exam bed with feet propped upon a step stool. He was prepped and draped in the usual sterile fashion. L4-L5 space located using bilateral iliac crests as landmarks. Approximately 1% Lidocaine without Epinephrine was used to anesthetize the area. A 20G spinal needle was introduced into the arachnoid space and when the stylet was removed, CSF flow was obtained. Approximately of CSF were collected into 4 separate sterile tubes. The stylet was inserted, and the spinal needle was removed after adequate fluid obtained. Blood loss was minimal. Tubes 2 & 3 were sent to [facility] for processing, Tubes 1 & 4 were taken to [facility] by the study coordinator. Betadine washed off participant's back and bandage placed over insertion site. Subject tolerated the procedure well with no complications observed and no complaints. Spontaneous movement of bilateral extremities was observed after the procedure. Participant remained in lying position for a 60-minute observation period. Written and verbal post-LP instructions were provided which included study coordinator and emergency contact numbers. Study coordinator will follow-up with a phone call in 1-2 days to assess for adverse events.

## 2023-07-26 ENCOUNTER — Ambulatory Visit: Admit: 2023-07-26 | Payer: PRIVATE HEALTH INSURANCE | Attending: Family | Primary: Family
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
## 2023-07-26 NOTE — Unmapped (Signed)
[facility] Tailored Care Management Contact Note    Care Manager/Extender called member for the purposes of care coordination and individual and/or family support. Client was not available, this writer was unable to leave message. Client's number was not in service. Care Manager followed up with a call to her mother but reached her son instead. He noted that she has been having trouble with her phone and he can let her know I have been trying to reach her.    POS: This encounter was generated from the following place of service: 11 OFFICE .           Whitman Hero, MSW

## 2023-07-27 ENCOUNTER — Ambulatory Visit: Admit: 2023-07-27 | Discharge: 2023-07-28 | Payer: PRIVATE HEALTH INSURANCE

## 2023-07-27 DIAGNOSIS — M8580 Other specified disorders of bone density and structure, unspecified site: Principal | ICD-10-CM

## 2023-07-27 DIAGNOSIS — B20 Human immunodeficiency virus [HIV] disease: Principal | ICD-10-CM

## 2023-07-27 DIAGNOSIS — F3341 Major depressive disorder, recurrent, in partial remission: Principal | ICD-10-CM

## 2023-07-27 DIAGNOSIS — G894 Chronic pain syndrome: Principal | ICD-10-CM

## 2023-07-27 DIAGNOSIS — E0590 Thyrotoxicosis, unspecified without thyrotoxic crisis or storm: Principal | ICD-10-CM

## 2023-07-27 DIAGNOSIS — I10 Essential (primary) hypertension: Principal | ICD-10-CM

## 2023-07-27 MED ORDER — METHIMAZOLE 5 MG TABLET
ORAL_TABLET | Freq: Every day | ORAL | 3 refills | 90 days | Status: CP
Start: 2023-07-27 — End: 2024-07-26

## 2023-07-27 MED ORDER — TIZANIDINE 2 MG TABLET
ORAL_TABLET | Freq: Three times a day (TID) | ORAL | 0 refills | 10 days | Status: CP | PRN
Start: 2023-07-27 — End: ?

## 2023-07-27 NOTE — Unmapped (Addendum)
Follows with spine ctr and upcoming appt for [facility] pain clinic. Ongoing shoulder/neck pain treated with pregabalin, gabapentin, lacosamide, and duloxetine. Neck pain worse now, improved before with steroid injections and tizanidine. Has PT coming up.   Plan: Continue with care per spine ctr and keep appt with Lerry Liner at pain clinic. Ordered short course of tizanidine. May consider another should injection, reassess after PT.   Orders:    tizanidine (ZANAFLEX) 2 MG tablet; Take 1 tablet (2 mg total) by mouth every eight (8) hours as needed.

## 2023-07-27 NOTE — Unmapped (Signed)
Internal Medicine Clinic Visit    Reason for visit: Follow up     A/P:  Assessment & Plan  Essential hypertension (RAF-HCC)  BP: 117/65 at goal and well controlled today on olmesartan 20mg  daily and amlodipine 10mg . Leg swelling appears to have improved. Continue with current treatment plan.          Human immunodeficiency virus (HIV) disease (CMS-HCC)  Lab Results   Component Value Date    ACD4 1,378 06/03/2023    CD4 53 06/03/2023    HIVCP 228 (H) 07/19/2016    HIVRS Not Detected 06/03/2023       Lab Results   Component Value Date    HIVRS Not Detected 06/03/2023      Well controlled on biktarvy. Follows with [facility], has appt coming up.          Hyperthyroidism  Well controlled on methimazole 5mg  daily. Last TSH 1.029. Will check thyroid function labs at next visit.  Orders:    methIMAzole (TAPAZOLE) 5 MG tablet; Take 1 tablet (5 mg total) by mouth in the morning.    Chronic pain syndrome  Follows with spine ctr and upcoming appt for [facility] pain clinic. Ongoing shoulder/neck pain treated with pregabalin, gabapentin, lacosamide, and duloxetine. Neck pain worse now, improved before with steroid injections and tizanidine. Has PT coming up.   Plan: Continue with care per spine ctr and keep appt with Lerry Liner at pain clinic. Ordered short course of tizanidine. May consider another should injection, reassess after PT.   Orders:    tizanidine (ZANAFLEX) 2 MG tablet; Take 1 tablet (2 mg total) by mouth every eight (8) hours as needed.    Recurrent major depressive disorder, in partial remission (CMS-HCC)  Patient currently on trazodone 50 mg, abilify 2 mg, seroquel 25 mg  and duloxetine 60 mg , followed by outside psychiatrist-may have ? schizoaffective      01/26/2023    12:00 PM   PHQ-9   PHQ-9 TOTAL SCORE 8   Reports still times of frustrationg  Plan: Continue meds           -FL2 forms filled out for pt.   Health Maintenance  Health Maintenance Due   Topic Date Due    COPD Spirometry  Never done    Mammogram Start Age 63  09/17/2021    Influenza Vaccine (1) 07/24/2023     -Need to schedule mammogram      Return in about 7 weeks (around 09/13/2023).    HPI:  Pt is a 57 y.o. female with a history of chronic pain, HIV, HTN, depression and tobacco use who presents for follow up.     Ongoing neck/shoulder pain, sees pain/spine specialists. PT coming up. Appt with lindsay parks at pain clinic coming up soon.     Staying in [place] with a friend, lost phone service but recently got back. Housing instability concerns ongoing. Seeking long-term care facility approval via medicaid completed those forms today.     Problem List:  Patient Active Problem List   Diagnosis    Human immunodeficiency virus (HIV) disease (CMS-HCC)    Hyperthyroidism    Suicide and self-inflicted injury (CMS-HCC)    Tobacco use disorder    Cocaine use disorder, mild, in sustained remission, abuse (CMS-HCC)    Alcohol use disorder, moderate, in sustained remission (CMS-HCC)    HIV (human immunodeficiency virus infection) (CMS-HCC)    Essential hypertension (RAF-HCC)    Hyperlipidemia    Osteopenia  Sciatica of right side    Cervical dysplasia    Joint pain in both hands    Chronic pain of left ankle    Arthralgia of multiple sites, bilateral    Recurrent major depressive disorder, in partial remission (CMS-HCC)    Colon cancer screening    Chronic midline low back pain with bilateral sciatica    Primary osteoarthritis of both hands    Chronic pain syndrome    Encounter for long-term (current) use of high-risk medication    Myalgia, multiple sites       Medications:  Reviewed in EPIC    Social History  Reviewed EPIC    Physical Exam:   Vital Signs:  Vitals:    07/27/23 0925   BP: 117/65   BP Site: L Arm   BP Position: Sitting   Pulse: 75   Resp: 18   Temp: 36.7 ??C (98.1 ??F)   TempSrc: Oral   SpO2: 97%   Weight: 71.6 kg (157 lb 12.8 oz)       Gen: well-appreaing, NAD  MSK: Right shoulder pain, right neck pain  CV: RRR  Pulm: normal work of breathing, cta b/l  Ext: No edema    Records review  Lab Results   Component Value Date    CREATININE 0.80 06/03/2023    CHOL 152 01/23/2018    HDL 51 01/23/2018    LDL 63 01/23/2018    NONHDL 101 01/23/2018    TRIG 192 (H) 01/23/2018    A1C 5.2 10/30/2020         The 10-year ASCVD risk score (Arnett DK, et al., 2019) is: 6.8%   Medication adherence and barriers to the treatment plan have been addressed. Opportunities to optimize healthy behaviors have been discussed. Patient / caregiver voiced understanding.           _________________________    This note was written by [employer] medical student Supradeep Madduri and edited by Jacquiline Doe, MD. I attest that I have reviewed the medical student note and that the components of the history of the present illness, the physical exam, and the assessment and plan documented were performed by me or were performed in my presence by the student where I verified the documentation and performed (or re-performed) the exam and medical decision making. Supradeep S Madduri

## 2023-07-27 NOTE — Unmapped (Addendum)
BP: 117/65 at goal and well controlled today on olmesartan 20mg  daily and amlodipine 10mg . Leg swelling appears to have improved. Continue with current treatment plan.

## 2023-07-27 NOTE — Unmapped (Addendum)
Well controlled on methimazole 5mg  daily. Last TSH 1.029. Will check thyroid function labs at next visit.  Orders:    methIMAzole (TAPAZOLE) 5 MG tablet; Take 1 tablet (5 mg total) by mouth in the morning.

## 2023-07-27 NOTE — Unmapped (Signed)
Patient currently on trazodone 50 mg, abilify 2 mg, seroquel 25 mg  and duloxetine 60 mg , followed by outside psychiatrist-may have ? schizoaffective      01/26/2023    12:00 PM   PHQ-9   PHQ-9 TOTAL SCORE 8   Reports still times of frustrationg  Plan: Continue meds

## 2023-07-27 NOTE — Unmapped (Addendum)
Lab Results   Component Value Date    ACD4 1,378 06/03/2023    CD4 53 06/03/2023    HIVCP 228 (H) 07/19/2016    HIVRS Not Detected 06/03/2023       Lab Results   Component Value Date    HIVRS Not Detected 06/03/2023      Well controlled on biktarvy. Follows with [facility] ID, has appt coming up.

## 2023-07-28 DIAGNOSIS — G894 Chronic pain syndrome: Principal | ICD-10-CM

## 2023-07-28 LAB — CBC W/ DIFFERENTIAL
BASOPHILS ABSOLUTE COUNT: 0 10*9/L
BASOPHILS RELATIVE PERCENT: 0 %
EOSINOPHILS ABSOLUTE COUNT: 0.1 10*9/L
EOSINOPHILS RELATIVE PERCENT: 1 %
HEMATOCRIT: 41.4 %
HEMOGLOBIN: 14 g/dL
IMMATURE GRANS (ABS): 0
IMMATURE GRANULOCYTES: 0
LYMPHOCYTES ABSOLUTE COUNT: 2.5 10*9/L
LYMPHOCYTES RELATIVE PERCENT: 32 %
MEAN CORPUSCULAR HEMOGLOBIN CONC: 33.8 g/dL
MEAN CORPUSCULAR HEMOGLOBIN: 31.9 pg
MEAN CORPUSCULAR VOLUME: 94 fL
MONOCYTES ABSOLUTE COUNT: 0.4 10*9/L
MONOCYTES RELATIVE PERCENT: 5 %
NEUTROPHILS ABSOLUTE COUNT: 4.8 10*9/L
NEUTROPHILS RELATIVE PERCENT: 62 %
PLATELET COUNT: 241 10*9/L
RED BLOOD CELL COUNT: 4.39 10*12/L
RED CELL DISTRIBUTION WIDTH: 13.1 %
WHITE BLOOD CELL COUNT: 7.8 10*9/L

## 2023-07-28 LAB — LYMPHOCYTE MARKERS LIMITED
% CD 4 POS. LYMPH.: 54.2
% CD 8 POS. LYMPH.: 29.6
ABSOLUTE CD 4 HELPER: 1355
ABSOLUTE CD 8 (SUPP): 740
CD4/CD8 RATIO: 1.83

## 2023-07-28 MED ORDER — MELOXICAM 7.5 MG TABLET
ORAL_TABLET | Freq: Every day | ORAL | 0 refills | 30 days | Status: CP | PRN
Start: 2023-07-28 — End: 2024-07-27

## 2023-07-28 NOTE — Unmapped (Signed)
07/22/2023 study lab results entered into lab flowsheet; [facility] [id number], Visit 2 -- progress note

## 2023-08-01 ENCOUNTER — Ambulatory Visit: Admit: 2023-08-01 | Discharge: 2023-08-02 | Payer: PRIVATE HEALTH INSURANCE

## 2023-08-01 DIAGNOSIS — F17200 Nicotine dependence, unspecified, uncomplicated: Principal | ICD-10-CM

## 2023-08-01 DIAGNOSIS — J432 Centrilobular emphysema: Principal | ICD-10-CM

## 2023-08-01 DIAGNOSIS — E0590 Thyrotoxicosis, unspecified without thyrotoxic crisis or storm: Principal | ICD-10-CM

## 2023-08-01 MED ORDER — METHIMAZOLE 5 MG TABLET
ORAL_TABLET | Freq: Every day | ORAL | 0 refills | 30 days | Status: CP
Start: 2023-08-01 — End: 2023-08-31
  Filled 2023-08-01: qty 30, 30d supply, fill #0

## 2023-08-01 MED ORDER — VARENICLINE 1 MG TABLET
ORAL_TABLET | Freq: Two times a day (BID) | ORAL | 2 refills | 30 days | Status: CP
Start: 2023-08-01 — End: 2023-10-30

## 2023-08-01 MED ORDER — OMEPRAZOLE 20 MG CAPSULE,DELAYED RELEASE
ORAL_CAPSULE | Freq: Every day | ORAL | 3 refills | 90 days | Status: CP
Start: 2023-08-01 — End: 2024-07-31
  Filled 2023-08-01: qty 90, 90d supply, fill #0

## 2023-08-01 MED FILL — MELOXICAM 7.5 MG TABLET: ORAL | 30 days supply | Qty: 30 | Fill #0

## 2023-08-01 NOTE — Unmapped (Signed)
Things that we talked about today:  I'd like you to use your Symbicort inhaler 2 puffs in the morning and 2 puffs in the evening. You can continue to use the Spiriva inhaler once a day and your albuterol inhaler as needed.  We will arrange for breathing tests when you come back and see me in a few months.   You are doing great with cutting down on smoking - I've refilled the Chantix and I want you to let me know if you are having cravings.     Please take your medications as prescribed.  Thank you for allowing me to be a part of your care.  Please call the clinic with any questions.    *Being smoke-free makes a difference!  - Increased energy  - Decreased chance of cancer and heart disease  - Better healing from illness and injury  - Saves money  - Saves time    *By using a combination of medication and counseling, we can help you quit*  - Medications help you manage withdrawal symptoms while you quit  - Medications can help decrease urges and cravings to smoke    *It might take many attempts to quit, but every time you try gets you closer to quitting!*    Rosemarie Beath, MD  Pulmonary and Critical Care Medicine  [address]  [address]  [place], [place] [address]    Thank you for your visit to the [facility].  You may receive a survey from [facility] regarding your visit today, and we are eager to use this feedback to improve your experience.  Thank you for taking the time to fill it out.    Between appointments, you can reach [place] at these numbers:    For appointments or the ID Nurse: [phone]  Fax: [phone]    For urgent issues after hours:  Hospital Operator: [phone], ask for Pulmonary Fellow on call

## 2023-08-01 NOTE — Unmapped (Signed)
HIV-PULMONARY CLINIC VISIT:    Patient: Kristy Garcia(1966/04/09)   Reason for visit: COPD    HISTORY OF PRESENT ILLNESS:     Patient is a 57 year old lady with a PMH as below including fibromyalgia, hyperthyroidism, MDD, HIV on ART, and COPD who presents today for evaluation and treatment of her COPD and tobacco use.     HISTORY OF PRESENT ILLNESS (07/2023):   Kristy Garcia reports that she believes she was diagnosed with COPD years ago, but was started on Symbicort within the last year. She currently uses that - 2 puffs once a day. Spiriva was added to her regimen about 3-4 months ago - 1 puff once a day. She notes that prior to starting these inhalers, she struggled with walking up a flight of stairs; however, she can now climb them without dyspnea. She also reports a significant decrease in her daily cough though she still has it occasionally; it is typically productive of clear sputum without blood. She does still have intermittent wheezing, which is more frequent at night. She reports infrequent use of her rescue albuterol, once every few days. She denies any chest pain, fevers, chills, or lower extremity swelling. She has required at least 3 course of oral prednisone for her breathing in the past year, that she can recall. She denies any history of childhood asthma or respiratory illnesses. She does report that breathing issues are often triggered by very strong smells such as perfume, and very hot or very cold temperatures. She does have seasonal allergies in the spring and summer - she manages these with an oral antihistamine, tylenol sinus, and flonase. She reports that she very rarely snores and has never been told that she gasps or chokes in her sleep. She generally wakes up well-rested but does need to take naps during the day. She denies any morning headaches. She does have uncontrolled acid reflux 3-4 times a week and was previously on a PPI but has not had it recently due to cost.     Medication Adherence: Adherent most of the time.  Inhaler Technique: Using inhaler properly.    Pulmonary medications: Spiriva, Symbicort    HIV status:  Date: CD4 Viral load   03/2021 1248 ND   07/2022 1643 ND   06/2023 1355 <20     Current regimen: BIktarvy    REVIEW OF SYSTEMS: All systems were reviewed and found to be negative except as above in the HPI.    PAST MEDICAL HISTORY:     MEDICAL/SURGICAL HISTORY:  Past Medical History:   Diagnosis Date    Asthma     Bronchitis     Chest pain 01/23/2018    Dental caries     Depression     Depressive disorder     R/o substance induced mood disorder    Fibromyalgia     HIV (human immunodeficiency virus infection) (CMS-HCC)     HLD (hyperlipidemia)     Hypertension     Hypothyroidism     Palpitations 01/23/2018    Recurrent Candidal vulvovaginitis     Reflux      Past Surgical History:   Procedure Laterality Date    DILATION AND CURETTAGE OF UTERUS      EYE SURGERY Left     PR COLONOSCOPY FLX DX W/COLLJ SPEC WHEN PFRMD N/A 12/31/2016    Procedure: COLONOSCOPY, FLEXIBLE, PROXIMAL TO SPLENIC FLEXURE; DIAGNOSTIC, W/WO COLLECTION SPECIMEN BY BRUSH OR WASH;  Surgeon: Andrey Farmer, MD;  Location: [facility]  MOB GI PROCEDURES [facility];  Service: Gastroenterology    PR REPAIR ING HERNIA,5+Y/O,STRANG Left 04/17/2019    Procedure: PRIORITY REPAIR INITIAL INGUINAL HERNIA, AGE 38 YEARS OR OLDER; INCARCERATED OR STRANGULATED;  Surgeon: Letha Cape, MD;  Location: [facility] OR [facility];  Service: General Surgery       SOCIAL AND FAMILY HISTORY:   Social history: She is currently living with a friend temporarily and is applying for TCL housing. There are no pets in the home. 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 She started smoking at age 55 and at most, smoked 1 pack per day (42 pack-years). She has been using Chantix intermittently over the past 6 months and with this, has been able to cut down to 7-8 cpd. She reports no cravings with the Chantix. She reports prior inhaled cocaine use, last 10-20 years ago.     Family History: Mother, brother and son all have asthma.Colon cancer runs on both sides of the family.     MEDICATIONS AND ALLERGIES:     CURRENT MEDICATIONS:  Outpatient Encounter Medications as of 08/01/2023   Medication Sig Dispense Refill    acetaminophen (TYLENOL 8 HOUR) 650 MG CR tablet Take 2 tablets (1,300 mg total) by mouth in the morning.      albuterol HFA 90 mcg/actuation inhaler Inhale 2 puffs every four (4) hours as needed for wheezing. 18 g 2    amlodipine (NORVASC) 10 MG tablet Take 1 tablet (10 mg total) by mouth daily. 90 tablet 0    aripiprazole (ABILIFY) 2 MG tablet Take 1 tablet (2 mg total) by mouth every morning.      B-complex with vitamin C (TOTAL B W/C) tablet Take 1 tablet by mouth daily. 30 tablet 12    bictegrav-emtricit-tenofov ala (BIKTARVY) 50-200-25 mg tablet Take 1 tablet by mouth daily. 30 tablet 10    budesonide-formoterol (SYMBICORT) 80-4.5 mcg/actuation inhaler Inhale 2 puffs two (2) times a day. 6.9 g 5    celecoxib (CELEBREX) 100 MG capsule Take 1 capsule (100 mg total) by mouth two (2) times a day. 60 capsule 5    diclofenac sodium (VOLTAREN) 1 % gel Apply 2 g topically four (4) times a day. 450 g 5    DULoxetine (CYMBALTA) 60 MG capsule Take 1 capsule (60 mg total) by mouth daily. 90 capsule 3    DULoxetine (CYMBALTA) 60 MG capsule Take 1 capsule every day by oral route at bedtime for 90 days. 90 capsule 1    fluticasone propionate (FLONASE) 50 mcg/actuation nasal spray Instill 1 spray into each nostril daily. 16 g 0    gabapentin (NEURONTIN) 300 MG capsule Take 2 capsules (600 mg total) by mouth at bedtime. 270 capsule 1    lacosamide (VIMPAT) 50 mg Tab Take 1 tablet (50 mg total) by mouth two (2) times a day. Start with 1 tablet at night for 3 days.  Then increase to 1 tablet twice daily.  Message provider after 1 week. 60 tablet 2    lidocaine (LIDODERM) 5 % patch Apply 1 patch to affected area for 12 hours only each day (then remove patch) 60 patch 0    meloxicam (MOBIC) 7.5 MG tablet Take 1 tablet (7.5 mg total) by  mouth daily as needed for pain. 30 tablet 0    methIMAzole (TAPAZOLE) 5 MG tablet Take 1 tablet (5 mg total) by mouth in the morning. 90 tablet 3    olmesartan (BENICAR) 20 MG tablet Take 1 tablet (20 mg total) by mouth daily. 90 tablet 0    pravastatin (PRAVACHOL) 40 MG tablet Take 1 tablet (40 mg total) by mouth daily. 90 tablet 3    pregabalin (LYRICA) 75 MG capsule Take 1 capsule (75 mg total) by mouth two (2) times a day. 180 capsule 0    QUEtiapine (SEROQUEL) 25 MG tablet Take 1 full tablet by mouth nightly. 90 tablet 1    tiotropium (SPIRIVA) 18 mcg inhalation capsule Place 1 capsule (18 mcg total) into inhaler and inhale daily. 30 capsule 11    tizanidine (ZANAFLEX) 2 MG tablet Take 1 tablet (2 mg total) by mouth every eight (8) hours as needed. 30 tablet 0    traZODone (DESYREL) 50 MG tablet Take 1 tablet (50 mg total) by mouth nightly. 90 tablet 3    traZODone (DESYREL) 50 MG tablet Take 1-2 tablets by mouth as needed for sleep. 180 tablet 1     No facility-administered encounter medications on file as of 08/01/2023.       ALLERGIES:  Allergies as of 08/01/2023 - Reviewed 08/01/2023   Allergen Reaction Noted    Codeine Nausea And Vomiting, Rash, and Hives 05/17/2013    Ibuprofen  04/17/2019    Adhesive tape-silicones  06/04/2021    Adhesive Itching 12/16/2017       PHYSICAL EXAM:     Vitals:    08/01/23 0840   BP: 139/70   BP Site: L Arm   BP Position: Sitting   BP Cuff Size: Medium   Pulse: 64   Temp: 35.8 ??C (96.5 ??F)   TempSrc: Temporal   SpO2: 98%   Weight: 73 kg (161 lb)   Height: 161.7 cm (5' 3.66)    Body mass index is 27.93 kg/m??.    GENERAL: well nourished, cooperative, no acute distress  EYES: anicteric, noninjected, EOMi  HEENT: Neck supple, trachea midline, moist mucus membranes, oropharynx without lesions or thrush, sinuses nontender  LYMPH: no cervical, submandibular, or supraclavicular lymphadenopathy  CV: Regular rate, normal rhythm, no murmurs/rubs/gallops  PULM: Clear to auscultation bilaterally. No dullness to percussion. Normal excursion. Normal work of breathing.   ABD: Soft, nontender, nondistended.   EXTREMITIES: No digital clubbing. No edema.  SKIN: No rashes, lesions, or skin breakdown. Warm and well perfused.  NEURO: No focal neurologic deficits. Moves all extremities and follows commands.   PSYCH: Well groomed, appropriate mood and affect, good eye contact.       ANCILLARY DATA:     Date: FVC (% Pred) FEV1 (% Pred)  Pre-BD FEV1 (%Pred)  Post- BD FEV1/FVC DLCO (% Pred) VC (% Pred) TLC (% Pred) RV (%Pred)                CT LCS (07/2022): Mild centrilobular emphysema. Scattered right upper lobe granulomas. 44 right upper lobe nodule.     Echocardiogram (06/2023): LV normal size and wall thickness with normal systolic function. RV normal in size with normal systolic function.     All imaging and labs were reviewed personally.    ASSESSMENT AND PLAN:     Kristy Garcia is a 57 year old lady with a PMH including fibromyalgia, hyperthyroidism, MDD, HIV on ART, and COPD who presents today for evaluation and treatment of  her COPD and tobacco use.     Although she has not had spirometry to confirm the diagnosis of COPD, she certainly has mild emphysema on her CT chest and has been treated multiple times for exacerbations with systemic steroids with benefit, which support the diagnosis. We will obtain PFTs at her next visit to confirm diagnosis and characterize airflow limitation for staging. I also wonder about the possibility of asthma overlap given her typical triggers.     She is on good COPD treatment with triple therapy though I did recommend that she increase the ICS/LABA component to BID. It's not clear if the exacerbations pre-dated the triple therapy so we will continue to monitor; if she has frequent exacerbations, we can consider azithromycin versus roflumilast. I suspect her nighttime symptoms may be related to her uncontrolled acid reflux, I have sent a prescription for PPI therapy to see if this helps.     We discussed smoking cessation extensively, including the potential impact on COPD progression. I have refilled her Chantix and we discussed setting a quit date and removing all cigarettes from her living space.     She is presently engaged in lung cancer screening and will be due again in February 2025.     She is up to date on her pneumonia and TdaP vaccines. She plans to get her influenza and COVID vaccines at her PCP appointment in October.     I will have her follow up in 4 months or sooner as needed.     I personally spent 65 minutes face-to-face and non-face-to-face in the care of this patient, which includes all pre, intra, and post visit time on the date of service.  All documented time was specific to the E/M visit and does not include any procedures that may have been performed.

## 2023-08-03 NOTE — Unmapped (Signed)
[facility] Refill Coordination Note    Specialty Medication(s) to be Shipped:   Infectious Disease: Biktarvy    Other medication(s) to be shipped:  chantix     Kristy Garcia, DOB: 07-Jul-1966  Phone: There are no phone numbers on file.      All above HIPAA information was verified with patient.     Was a Nurse, learning disability used for this call? No    Completed refill call assessment today to schedule patient's medication shipment from the [facility] [phone]).  All relevant notes have been reviewed.     Specialty medication(s) and dose(s) confirmed: Regimen is correct and unchanged.   Changes to medications: Sarayah reports no changes at this time.  Changes to insurance: No  New side effects reported not previously addressed with a pharmacist or physician: None reported  Questions for the pharmacist: No    Confirmed patient received a Conservation officer, historic buildings and a Surveyor, mining with first shipment. The patient will receive a drug information handout for each medication shipped and additional FDA Medication Guides as required.       DISEASE/MEDICATION-SPECIFIC INFORMATION        N/A    SPECIALTY MEDICATION ADHERENCE     Medication Adherence    Patient reported X missed doses in the last month: 0  Specialty Medication: BIKTARVY 50-200-25 mg tablet (bictegrav-emtricit-tenofov ala)  Patient is on additional specialty medications: No  Informant: patient              Were doses missed due to medication being on hold? No     BIKTARVY 50-200-25 mg tablet (bictegrav-emtricit-tenofov ala): 5 days of medicine on hand     REFERRAL TO PHARMACIST     Referral to the pharmacist: Not needed      [facility]     Shipping address confirmed in Epic.       Delivery Scheduled: Yes, Expected medication delivery date: 08/08/23.     Medication will be delivered via Same Day Courier to the temporary address in Epic WAM.    Craige Cotta   [facility] Specialty Technician

## 2023-08-08 DIAGNOSIS — F17200 Nicotine dependence, unspecified, uncomplicated: Principal | ICD-10-CM

## 2023-08-08 NOTE — Unmapped (Signed)
Kristy Garcia 's biktarvy shipment will be rescheduled as a result of order held up due to insufficient inventory of non-spec medication.     I have spoken with the patient  at [phone]  and communicated the delay. We will reschedule the medication for the delivery date that the patient agreed upon.  We have confirmed the delivery date as 9/17, via same day courier.

## 2023-08-09 MED FILL — VARENICLINE 1 MG TABLET: ORAL | 28 days supply | Qty: 56 | Fill #0

## 2023-08-09 MED FILL — BIKTARVY 50 MG-200 MG-25 MG TABLET: ORAL | 30 days supply | Qty: 30 | Fill #1

## 2023-08-11 ENCOUNTER — Ambulatory Visit: Admit: 2023-08-11 | Payer: PRIVATE HEALTH INSURANCE

## 2023-08-11 NOTE — Unmapped (Unsigned)
[facility] PT [facility]  OUTPATIENT PHYSICAL THERAPY  08/11/2023          Patient Name: Kristy Garcia  Date of Birth:29-Sep-1966  Diagnosis: No diagnosis found.  Referring Provider: Birdie Sons    Date of Onset of Impairment: No date available  Date PT Care Plan Established or Reviewed: No date available  Date PT Treatment Started: No date available     Plan of Care Effective Date: ***  Session Number:  1    ASSESSMENT & PLAN   Assessment  Assessment details:      Kristy Garcia is a pleasant 57 y.o. female who presents for Physical Therapy Evaluation with ***. Primary impairments include ***. Clinical presentation today is consistent with ***. *** The patient will benefit from skilled Physical Therapy intervention to address the {jmkcomplexity:78453} impairments listed below and to assist the patient in maximizing her functional independence and safe return to prior level of function.             Impairments: decreased endurance, pain, decreased strength, decreased range of motion and impaired motor control      Personal Factors/Comorbidities: 3+      Examination of Body Systems: musculoskeletal, activity/participation and communication    Clinical Presentation: stable    Clinical Decision Making: low    Prognosis: good prognosis    Negative Prognosis Rationale: medical status/condition, chronicity of condition and severity of symptoms.      Therapy Goals      Goals:        1. In 12 weeks the patient will demonstrate independent performance of HEP to maintain functional gains.   2. In 12 weeks the patient will demonstrate *** to indicate progression towards return to PLOF.   3. In 12 weeks the patient will score {JMKOUTCOMES:65779} to indicate improved activity tolerance.   4. In 12 weeks the patient will {JMKGOALS:65806}        Plan    Therapy options: will be seen for skilled physical therapy services    Planned therapy interventions: Balance Training, Education - Patient, Endurance Activites, Functional Mobility, Home Exercise Program, Education - Family/Caregiver, E-Stim, [employer], Civil engineer, contracting, Diaphragmatic/Pursed-lip Breathing, 97113-Aquatic Therapy, 97112-Neuromuscular Re-education, 97110-Therapeutic Exercises, 97116-Gait Training, 97140-Manual Therapy, 97530-Therapeutic Activities, 97535-Self-Care/Home Training, 97750-Physical Performance Test and 21308, 20561-Dry Needling 1-2, 3+ areas    DME Equipment: [employer].    Frequency: 1x week    Duration in weeks: 12    Education provided to: patient.    Education provided: HEP, Treatment options and plan, Symptom management, Safety education, Importance of Therapy, Anatomy, Body mechanics, Role of therapy in Rehabilitation, Posture, Community resources and Body awareness    Education results: verbalized good understanding, demonstrates understanding and needs reinforcement.    Communication/Consultation: N/A.              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 Within the past 12 months, we worried whether our food would run out before we got money to buy more. {hungeroptions:71899::Never True}  2. Within the past 12 months, the food we bought just didn't last, and we didn't have money to get more. {hungeroptions:71899::Never True}  If patient identifies with either of the above, patient was provided with local food resource guide and non perishables when possible.    OBJECTIVE     General Observations  Pleasant female, NAD. Ambulates with {jmkAD:76117}    Posture  Sitting: {KC SITTING POSTURE:45277::unremarkable}  Standing: {KC Standing Posture:45279::unremarkable}      Range of Motion - Lumbar Spine     No ROM Loss  (0%) Min ROM Loss  (1-25%) Mod ROM Loss  (26-65%) Major ROM Loss  (66-100%)   Symptoms    Flexion     {JMKLUMBARROM:65831}   Extension     {JMKLUMBARROM:65831}   Side Bend Right     {JMKLUMBARROM:65831}   Side Bend Left     {JMKLUMBARROM:65831}   Sideglide Right     {JMKLUMBARROM:65831}   Sideglide Left     {JMKLUMBARROM:65831}   Observable movement coordination impairments of the lumbopelvic region with flexion and extension movements or while performing daily physical activities:  {YESNO:90459::NO}     Thoracic Active Range of Motion  Flexion {jmklimited:68672}   Extension {jmklimited:68672}   R Rotation {jmklimited:68672}   L Rotation {jmklimited:68672}   R Side Flexion {jmklimited:68672}   L Side Flexion {jmklimited:68672}       Neurological  Strength, tested in sitting   Right Left   Hip Flexion (L2) {JMKMMT:65804::5/5} {JMKMMT:65804::5/5}   Hip Abduction {JMKMMT:65804::5/5} {JMKMMT:65804::5/5}   Hip Adduction {JMKMMT:65804::5/5} {JMKMMT:65804::5/5}   Knee Extension (L3) {JMKMMT:65804::5/5} {JMKMMT:65804::5/5}   Knee Flexion {JMKMMT:65804::5/5} {JMKMMT:65804::5/5}   Ankle DF (L4) {JMKMMT:65804::5/5} {JMKMMT:65804::5/5}   Great toe extension {JMKMMT:65804::5/5} {JMKMMT:65804::5/5}   Ankle PF (S1) {JMKMMT:65804::5/5} {JMKMMT:65804::5/5}     Muscle Endurance Measures  Trunk flexor strength (double-leg lowering test): ***   Trunk extensors strength (Sorensen test): ***   Lateral abdominals and hip abductors (eg, side plank): ***   Hip and thigh muscle performance (star excursion balance tests): ***     Denies Sensory Changes ***     Special Tests   Right Left   Slump {jmkposneg:66113} {jmkposneg:66113}   SLR {jmkposneg:66113} {jmkposneg:66113}   Babinski {jmkposneg:66113} {jmkposneg:66113}   Clonus {jmkposneg:66113} {jmkposneg:66113}       Palpation  TTP: ***    Transfers  Sit to stand: ***    Balance  SLS on level ground:  - Right: *** seconds  - Left: *** seconds    Gait  {Gait Analysis:48459::non-antalgic}    Written Outcome Measures  Revised Oswestry: ***  A score of 0-20% indicates minimal disability. 21-40% indicates moderate disability. 41-60% indicates severe disability. 61-80% indicates crippling disability. 81-100% indicates the patient may be bed bound and careful evaluation is recommended. The Minimal Detectable Change (MDC) is 10 points.   FABQ: ***   PCS: ***     Functional Outcome Measures  TUG: ***  5 STS: ***  30s STS: ***   2 MWT: ***        {JMKLBPTBC:95011}  Active Education one-on-one education on the bio-psychosocial contributors to pain and self-management techniques, such as remaining active, pacing strategies, and back-protection techniques. Reinforced the inherent strength of the human spine.         TREATMENT RENDERED     Interpreter Use: {JMKINTERP:89310}    Therapeutic Exercise:  *** Minutes   Performed with direct PT demonstration, instruction, supervision, and guidance.   - Education on condition, prognosis, and PT POC  - HEP review as below       Manual Therapy: *** Minutes   -         Next Visit  Plan: ***      Total Treatment Time: *** Minutes                          I attest that I have reviewed the above information.  Signed: Barron Alvine, PT, DPT  08/11/2023 8:10 AM        I reviewed the no-show/attendance policy with the patient and caregiver(s). The patient is aware that they must call to cancel appointments more than 24 hours in advance. They are also aware that if they late cancel or no-show three times, we reserve the right to cancel their remaining appointments. This policy is in place to allow [place] to best serve the needs of our caseload.    If patient returns to clinic with variance in plan of care, then it may be attributable to one or more of the following factors: preferred clinician availability, appointment time request availability, therapy pool appointment availability, major holiday with clinic closure, caregiver availability, patient transportation, conflicting medical appointment, inclement weather, and/or patient illness.    If patient does not return for follow up visit(s) related to this episode of care, this note will serve as their discharge note from Physical Therapy.

## 2023-08-18 ENCOUNTER — Ambulatory Visit: Admit: 2023-08-18 | Discharge: 2023-08-19 | Payer: PRIVATE HEALTH INSURANCE | Attending: Family | Primary: Family

## 2023-08-18 DIAGNOSIS — B20 Human immunodeficiency virus [HIV] disease: Principal | ICD-10-CM

## 2023-08-18 DIAGNOSIS — Z9189 Other specified personal risk factors, not elsewhere classified: Principal | ICD-10-CM

## 2023-08-18 DIAGNOSIS — Z13220 Encounter for screening for lipoid disorders: Principal | ICD-10-CM

## 2023-08-18 DIAGNOSIS — Z1151 Encounter for screening for human papillomavirus (HPV): Principal | ICD-10-CM

## 2023-08-18 DIAGNOSIS — E0590 Thyrotoxicosis, unspecified without thyrotoxic crisis or storm: Principal | ICD-10-CM

## 2023-08-18 DIAGNOSIS — Z113 Encounter for screening for infections with a predominantly sexual mode of transmission: Principal | ICD-10-CM

## 2023-08-18 DIAGNOSIS — Z0000 Encounter for general adult medical examination without abnormal findings: Principal | ICD-10-CM

## 2023-08-18 DIAGNOSIS — Z1212 Encounter for screening for malignant neoplasm of rectum: Principal | ICD-10-CM

## 2023-08-18 DIAGNOSIS — Z79899 Other long term (current) drug therapy: Principal | ICD-10-CM

## 2023-08-18 DIAGNOSIS — Z23 Encounter for immunization: Principal | ICD-10-CM

## 2023-08-18 DIAGNOSIS — Z1231 Encounter for screening mammogram for malignant neoplasm of breast: Principal | ICD-10-CM

## 2023-08-18 LAB — LIPID PANEL
CHOLESTEROL/HDL RATIO SCREEN: 3.5 (ref 1.0–4.5)
CHOLESTEROL: 138 mg/dL (ref <=200)
HDL CHOLESTEROL: 40 mg/dL (ref 40–60)
LDL CHOLESTEROL CALCULATED: 66 mg/dL (ref 40–99)
NON-HDL CHOLESTEROL: 98 mg/dL (ref 70–130)
TRIGLYCERIDES: 162 mg/dL — ABNORMAL HIGH (ref 0–150)
VLDL CHOLESTEROL CAL: 32.4 mg/dL (ref 11–40)

## 2023-08-18 LAB — CBC W/ AUTO DIFF
BASOPHILS ABSOLUTE COUNT: 0 10*9/L (ref 0.0–0.1)
BASOPHILS RELATIVE PERCENT: 0.4 %
EOSINOPHILS ABSOLUTE COUNT: 0.1 10*9/L (ref 0.0–0.5)
EOSINOPHILS RELATIVE PERCENT: 0.8 %
HEMATOCRIT: 40.2 % (ref 34.0–44.0)
HEMOGLOBIN: 13.5 g/dL (ref 11.3–14.9)
LYMPHOCYTES ABSOLUTE COUNT: 3.1 10*9/L (ref 1.1–3.6)
LYMPHOCYTES RELATIVE PERCENT: 32.4 %
MEAN CORPUSCULAR HEMOGLOBIN CONC: 33.5 g/dL (ref 32.0–36.0)
MEAN CORPUSCULAR HEMOGLOBIN: 31.8 pg (ref 25.9–32.4)
MEAN CORPUSCULAR VOLUME: 95 fL (ref 77.6–95.7)
MEAN PLATELET VOLUME: 9.5 fL (ref 6.8–10.7)
MONOCYTES ABSOLUTE COUNT: 0.4 10*9/L (ref 0.3–0.8)
MONOCYTES RELATIVE PERCENT: 4.7 %
NEUTROPHILS ABSOLUTE COUNT: 5.9 10*9/L (ref 1.8–7.8)
NEUTROPHILS RELATIVE PERCENT: 61.7 %
PLATELET COUNT: 227 10*9/L (ref 150–450)
RED BLOOD CELL COUNT: 4.23 10*12/L (ref 3.95–5.13)
RED CELL DISTRIBUTION WIDTH: 14.2 % (ref 12.2–15.2)
WBC ADJUSTED: 9.5 10*9/L (ref 3.6–11.2)

## 2023-08-18 LAB — COMPREHENSIVE METABOLIC PANEL
ALBUMIN: 4 g/dL (ref 3.4–5.0)
ALKALINE PHOSPHATASE: 81 U/L (ref 46–116)
ALT (SGPT): 15 U/L (ref 10–49)
ANION GAP: 5 mmol/L (ref 5–14)
AST (SGOT): 17 U/L (ref <=34)
BILIRUBIN TOTAL: 0.3 mg/dL (ref 0.3–1.2)
BLOOD UREA NITROGEN: 14 mg/dL (ref 9–23)
BUN / CREAT RATIO: 16
CALCIUM: 9.9 mg/dL (ref 8.7–10.4)
CHLORIDE: 109 mmol/L — ABNORMAL HIGH (ref 98–107)
CO2: 25 mmol/L (ref 20.0–31.0)
CREATININE: 0.86 mg/dL
EGFR CKD-EPI (2021) FEMALE: 78 mL/min/{1.73_m2} (ref >=60)
GLUCOSE RANDOM: 102 mg/dL (ref 70–179)
POTASSIUM: 4.1 mmol/L (ref 3.4–4.8)
PROTEIN TOTAL: 7.3 g/dL (ref 5.7–8.2)
SODIUM: 139 mmol/L (ref 135–145)

## 2023-08-18 LAB — HEMOGLOBIN A1C
ESTIMATED AVERAGE GLUCOSE: 126 mg/dL
HEMOGLOBIN A1C: 6 % — ABNORMAL HIGH (ref 4.8–5.6)

## 2023-08-18 MED ORDER — METHIMAZOLE 5 MG TABLET
ORAL_TABLET | Freq: Every day | ORAL | 5 refills | 30 days | Status: CP
Start: 2023-08-18 — End: 2024-02-14
  Filled 2023-09-12: qty 30, 30d supply, fill #0
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
## 2023-08-18 NOTE — Unmapped (Signed)
COVID Education:  Make sure you perform good hand washing (lasting 20 seconds), continue to social distance and limit close personal contact (which may include new sexual partners or having multiple partners during this period).  Try to isolate at home but please find ways to keep in touch with those close to you, such as meeting up with them electronically or socially distanced, and the ability to go outdoors alone or separated from others  If you become ill with fever, respiratory illness, sudden loss of taste and smell, stomach issues, diarrhea, nausea, vomiting - contact clinic for further instructions.  You should go to the emergency department if you develop systems such as shortness of breath, confusion, lightheadedness when standing, high fever.   Here is some information about HIV and CoVid vaccines: [email or web address]  If you're interested in receiving the CoVid vaccine when you're eligible, here are some resources for you to check and make an appointment:  Your local health department   [email or web address] through [facility]  [email or web address]  [facility] (if you are an established patient with them)  [email or web address]    URGENT CARE  Please call ahead to speak with the nursing staff if you are in need of an urgent appointment.       MEDICATIONS  For refills please contact your pharmacy and ask them to electronically send or fax the request to the clinic.   Please bring all medications in original bottles to every appointment.    [facility] (formerly [facility]) or [employer] Eligibility (required even if you do not receive medication through [facility])  Please remember to renew your Juanell Fairly eligibility during renewal periods which occur twice a year: January-March and July-September.     The following are needed for each renewal:   - [facility] Identification (if you don't have one, then a bill with your name and address in [place])   - proof of income (award letter, W-2, or last three check stubs)   If you are unable to come in for renewal, let us know if we can mail, fax or e-mail paperwork to you.   HMAP Contact: [phone].     Lab info:  Your most recent CD4 T-cell counts and viral loads are below. Here are a few things to keep in mind when looking at your numbers:  Our goal is to get your virus to be undetectable and keep it undetectable. If the virus is undetectable you are much more likely to stay healthy.  We consider your viral load to be undetectable if it says <40 or if it says Not detected.  For most people, we're checking CD4 counts every other visit (once or twice a year, or sometimes even less).  It's normal for your CD4 count to be different from visit to visit.   You can help by taking your medications at about the same time, every single day. If you're having trouble with taking your medications, it's important to let us know.    Lab Results   Component Value Date    ACD4 1,378 06/03/2023    CD4 53 06/03/2023    HIVCP <20 07/22/2023    HIVRS Not Detected 06/03/2023        Please note that your laboratory and other results may be visible to you in real time, possibly before they reach your provider. Please allow 48 hours for clinical interpretation of these results. Importantly, even if a result is flagged as abnormal, it may not be one that impacts your  health.    It was nice to have a visit with you today!  Follow-up information:        Provider today:  Varney Daily, FNP-BC      ID CLINIC address:   [facility] Infectious Diseases Clinic at [facility]  [address]  [place], [place] [address]    Contact information:    The ID clinic phone number is [phone]   The ID clinic fax number is [phone]  For urgent issues on nights and weekends: Call the ID Physician on-call through the [facility] Operator at [phone].    Please sign up for My [facility] Chart - This is a great way to review your labs and track your appointments    Please try to arrive 30 minutes BEFORE your scheduled appointment time!  This will give you time to fill out any front desk paperwork needed for your visit, and allow you to be seen as close to your scheduled appointment time as possible.

## 2023-08-18 NOTE — Unmapped (Signed)
Refilled methimazole.

## 2023-09-08 DIAGNOSIS — I10 Essential (primary) hypertension: Principal | ICD-10-CM

## 2023-09-08 DIAGNOSIS — J302 Other seasonal allergic rhinitis: Principal | ICD-10-CM

## 2023-09-08 MED ORDER — FLUTICASONE PROPIONATE 50 MCG/ACTUATION NASAL SPRAY,SUSPENSION
Freq: Every day | NASAL | 5 refills | 120 days | Status: CP
Start: 2023-09-08 — End: 2024-09-07
  Filled 2023-09-12: qty 16, 60d supply, fill #0

## 2023-09-08 MED ORDER — AMLODIPINE 10 MG TABLET
ORAL_TABLET | ORAL | 3 refills | 90 days | Status: CP
Start: 2023-09-08 — End: ?
  Filled 2023-09-12: qty 90, 90d supply, fill #0

## 2023-09-08 MED ORDER — OLMESARTAN 20 MG TABLET
ORAL_TABLET | Freq: Every day | ORAL | 3 refills | 90 days | Status: CP
Start: 2023-09-08 — End: ?
  Filled 2023-09-12: qty 90, 90d supply, fill #0

## 2023-09-08 NOTE — Unmapped (Signed)
[facility] Specialty and Home Delivery Pharmacy Refill Coordination Note    Specialty Medication(s) to be Shipped:   Infectious Disease: Biktarvy    Other medication(s) to be shipped:  amlodipine, Ventolin HFA, gabapentin, methimazole, olmesartan, fluticasone, diclofenac, lidocaine  patches, pregabalin     Kristy Garcia, DOB: [birth year]  Phone: There are no phone numbers on file.      All above HIPAA information was verified with patient.     Was a Nurse, learning disability used for this call? No    Completed refill call assessment today to schedule patient's medication shipment from the [facility] and Home Delivery Pharmacy  [phone]).  All relevant notes have been reviewed.     Specialty medication(s) and dose(s) confirmed: Regimen is correct and unchanged.   Changes to medications: Latoria reports no changes at this time.  Changes to insurance: No  New side effects reported not previously addressed with a pharmacist or physician: None reported  Questions for the pharmacist: No    Confirmed patient received a Conservation officer, historic buildings and a Surveyor, mining with first shipment. The patient will receive a drug information handout for each medication shipped and additional FDA Medication Guides as required.       DISEASE/MEDICATION-SPECIFIC INFORMATION        N/A    SPECIALTY MEDICATION ADHERENCE     Medication Adherence    Patient reported X missed doses in the last month: 0  Specialty Medication: BIKTARVY 50-200-25 mg tablet (bictegrav-emtricit-tenofov ala)  Patient is on additional specialty medications: No  Patient is on more than two specialty medications: No  Any gaps in refill history greater than 2 weeks in the last 3 months: no  Demonstrates understanding of importance of adherence: yes  Informant: patient  Reliability of informant: reliable  Provider-estimated medication adherence level: good  Patient is at risk for Non-Adherence: No  Reasons for non-adherence: no problems identified              Were doses missed due to medication being on hold? No    BIKTARVY 50-200-25 mg tablet (bictegrav-emtricit-tenofov ala)  : 5 days of medicine on hand       REFERRAL TO PHARMACIST     Referral to the pharmacist: Not needed      [facility]     Shipping address confirmed in Epic.       Delivery Scheduled: Yes, Expected medication delivery date: 09/12/23.     Medication will be delivered via Same Day Courier to the prescription address in Epic WAM.    [facility]  Specialty Technician

## 2023-09-11 DIAGNOSIS — G894 Chronic pain syndrome: Principal | ICD-10-CM

## 2023-09-12 MED FILL — VENTOLIN HFA 90 MCG/ACTUATION AEROSOL INHALER: RESPIRATORY_TRACT | 17 days supply | Qty: 18 | Fill #2

## 2023-09-12 MED FILL — BIKTARVY 50 MG-200 MG-25 MG TABLET: ORAL | 30 days supply | Qty: 30 | Fill #2

## 2023-09-13 DIAGNOSIS — G8929 Other chronic pain: Principal | ICD-10-CM

## 2023-09-13 DIAGNOSIS — M5442 Lumbago with sciatica, left side: Principal | ICD-10-CM

## 2023-09-13 DIAGNOSIS — M5441 Lumbago with sciatica, right side: Principal | ICD-10-CM

## 2023-09-22 MED FILL — LIDOCAINE 5 % TOPICAL PATCH: TRANSDERMAL | 5 days supply | Qty: 10 | Fill #1

## 2023-09-22 MED FILL — DICLOFENAC 1 % TOPICAL GEL: TOPICAL | 63 days supply | Qty: 500 | Fill #1

## 2023-10-07 NOTE — Unmapped (Signed)
[facility] Specialty and Home Delivery Pharmacy Refill Coordination Note    Specialty Medication(s) to be Shipped:   Infectious Disease: Biktarvy    Other medication(s) to be shipped: patient denies needing any other medication, reports at least 2 weeks of all other medications     Kristy Garcia, DOB: 1966-11-10  Phone: [phone] (home)       All above HIPAA information was verified with patient.     Was a Nurse, learning disability used for this call? No    Completed refill call assessment today to schedule patient's medication shipment from the [facility] and Home Delivery Pharmacy  [phone]).  All relevant notes have been reviewed.     Specialty medication(s) and dose(s) confirmed: Regimen is correct and unchanged.   Changes to medications: Kristy Garcia reports no changes at this time.  Changes to insurance: No  New side effects reported not previously addressed with a pharmacist or physician: None reported  Questions for the pharmacist: No    Confirmed patient received a Conservation officer, historic buildings and a Surveyor, mining with first shipment. The patient will receive a drug information handout for each medication shipped and additional FDA Medication Guides as required.       DISEASE/MEDICATION-SPECIFIC INFORMATION        N/A    SPECIALTY MEDICATION ADHERENCE     Medication Adherence    Patient reported X missed doses in the last month: 0  Specialty Medication: biktarvy 50-200-25mg               Were doses missed due to medication being on hold? No    biktarvy 50-200-25mg    : 10 days of medicine on hand       REFERRAL TO PHARMACIST     Referral to the pharmacist: Not needed      [facility]     Shipping address confirmed in Epic.       Delivery Scheduled: Yes, Expected medication delivery date: 11/20.     Medication will be delivered via Next Day Courier to the prescription address in Epic WAM.    Kristy Garcia   [facility] Specialty and Home Delivery Pharmacy  Specialty Technician

## 2023-10-11 MED FILL — BIKTARVY 50 MG-200 MG-25 MG TABLET: ORAL | 30 days supply | Qty: 30 | Fill #3

## 2023-10-12 NOTE — Unmapped (Unsigned)
PAIN CLINIC  Last pain clinic visit date: 06/20/2023  Last PCP visit date 07/27/2023  Contract last signed: 01/04/2023  Narcan Rx: N/A  Last Utox/Opioid Confirmation: 01/04/2023    Toxicology Screen, Urine               Component  Ref Range & Units (hover) 01/04/23 1445 12/08/22 1608 04/08/22 0100 05/03/16 1221 08/25/15 1632 07/21/15 1120 09/27/13 1152    Amphetamines Screen, Ur Negative Negative Negative <500 ng/mL R <500 ng/mL R <500 ng/mL R <500 ng/mL R    Barbiturates Screen, Ur Negative Negative Negative <200 ng/mL R <200 ng/mL R <200 ng/mL R <200 ng/mL R    Benzodiazepines Screen, Urine Negative Negative Negative <200 ng/mL R <200 ng/mL R <200 ng/mL R <200 ng/mL R    Cannabinoids Screen, Ur Negative Negative Positive Abnormal  <20 ng/mL R <20 ng/mL R <20 ng/mL R <20 ng/mL R    Methadone Screen, Urine Negative Negative Negative <300 ng/mL R <300 ng/mL R <300 ng/mL R <300 ng/mL R    Cocaine(Metab.)Screen, Urine Negative Negative Negative =/>150 ng/mL Abnormal  R =/>150 ng/mL Abnormal  R =/>150 ng/mL Abnormal  R =/>150 ng/mL Abnormal  R    Opiates Screen, Ur Negative Negative Negative <300 ng/mL R <300 ng/mL R <300 ng/mL R <300 ng/mL R    Fentanyl Screen, Ur Negative Negative Negative        Oxycodone Screen, Ur Negative Negative Negative        Buprenorphine, Urine Negative Negative Negative       Resulting Agency [facility]        Opiates confirmation, random, urine         Component  Ref Range & Units (hover) 01/04/23 1445    6-Monoacetylmorphine <5    Morphine <25    Codeine <25    Hydrocodone <25    Hydromorphone <25    Oxycodone <25    Oxymorphone <25    Buprenorphine <5    Norbuprenorphine <5    Fentanyl <0.5    Norfentanyl <1.0    Interpretation NEGATIVE   Resulting Agency [facility]

## 2023-11-11 NOTE — Unmapped (Signed)
Patient declined refills for Biktarvy, patient states that she just received a delivery. Will move call out a few weeks to schedule next delivery.

## 2023-11-28 MED ORDER — NALOXONE 4 MG/ACTUATION NASAL SPRAY
0 refills | 0.00 days
Start: 2023-11-28 — End: ?

## 2023-11-28 NOTE — Unmapped (Signed)
[facility] Follow-up      Kristy Garcia    Opioid pain medication agreement last signed: 01/04/23    Most recent Naloxone [facility]) nasal spray Rx sent: N/A (order pended/may not need)    Last PDMP Review: 09/12/2023 12:51 PM    Last Opioid Dispensed Provider: Not Found    Recent Visits  Date Type Provider Dept   07/27/23 Office Visit Artelia Laroche, MD [facility]   07/07/23 Office Visit Jeannene Patella, MD [facility]   06/20/23 Office Visit Uvaldo Rising, FNP [place] [facility]     Last Drug Screen Date: 01/04/2023  Urine Toxicology Screen    Lab Results   Component Value Date    AMPHU Negative 01/04/2023    BARBU Negative 01/04/2023    BENZU Negative 01/04/2023    CANNAU Negative 01/04/2023    METHU Negative 01/04/2023    COCAU Negative 01/04/2023    OPIAU Negative 01/04/2023    FENTU Negative 01/04/2023    OXYCOU Negative 01/04/2023    BUPRENORPHIN Negative 01/04/2023        Last Drug Screen Date: 01/07/2023  Opiate Confirmation Test    6-Monoacetylmorphine   Date Value Ref Range Status   01/04/2023 <5 <5 ng/mL Final     Morphine   Date Value Ref Range Status   01/04/2023 <25 <25 ng/mL Final     Codeine   Date Value Ref Range Status   01/04/2023 <25 <25 ng/mL Final     Hydrocodone   Date Value Ref Range Status   01/04/2023 <25 <25 ng/mL Final     Hydromorphone   Date Value Ref Range Status   01/04/2023 <25 <25 ng/mL Final     Oxycodone   Date Value Ref Range Status   01/04/2023 <25 <25 ng/mL Final     Oxymorphone   Date Value Ref Range Status   01/04/2023 <25 <25 ng/mL Final     Buprenorphine   Date Value Ref Range Status   01/04/2023 <5 <5 ng/mL Final     Norbuprenorphine   Date Value Ref Range Status   01/04/2023 <5 <5 ng/mL Final     Fentanyl   Date Value Ref Range Status   01/04/2023 <0.5 <0.5 ng/mL Final     Norfentanyl   Date Value Ref Range Status   01/04/2023 <1.0 <1.0 ng/mL Final     Future Appointments  Date Type Provider Dept         12/28/23 Appointment Artelia Laroche, MD [place] [facility]   Showing future appointments within next 365 days and meeting all other requirements

## 2023-12-02 ENCOUNTER — Encounter: Admit: 2023-12-02 | Discharge: 2023-12-03 | Payer: PRIVATE HEALTH INSURANCE

## 2023-12-02 DIAGNOSIS — M25572 Pain in left ankle and joints of left foot: Principal | ICD-10-CM

## 2023-12-02 DIAGNOSIS — H538 Other visual disturbances: Principal | ICD-10-CM

## 2023-12-02 DIAGNOSIS — G894 Chronic pain syndrome: Principal | ICD-10-CM

## 2023-12-02 DIAGNOSIS — M5442 Lumbago with sciatica, left side: Principal | ICD-10-CM

## 2023-12-02 DIAGNOSIS — M5441 Lumbago with sciatica, right side: Principal | ICD-10-CM

## 2023-12-02 DIAGNOSIS — G8929 Other chronic pain: Principal | ICD-10-CM

## 2023-12-02 DIAGNOSIS — R519 Sudden onset of severe headache: Principal | ICD-10-CM

## 2023-12-02 DIAGNOSIS — M2550 Pain in unspecified joint: Principal | ICD-10-CM

## 2023-12-02 DIAGNOSIS — Z79899 Other long term (current) drug therapy: Principal | ICD-10-CM

## 2023-12-02 MED ORDER — NALOXONE 4 MG/ACTUATION NASAL SPRAY
0 refills | 0.00 days | Status: CP
Start: 2023-12-02 — End: ?
  Filled 2023-12-09: qty 2, 28d supply, fill #0

## 2023-12-02 MED ORDER — LACOSAMIDE 50 MG TABLET
ORAL_TABLET | Freq: Two times a day (BID) | ORAL | 2 refills | 30.00 days | Status: CN
Start: 2023-12-02 — End: ?

## 2023-12-02 MED ORDER — TIZANIDINE 2 MG TABLET
ORAL_TABLET | Freq: Three times a day (TID) | ORAL | 0 refills | 10.00 days | Status: CN | PRN
Start: 2023-12-02 — End: ?

## 2023-12-02 MED ORDER — LIDOCAINE 5 % TOPICAL PATCH
MEDICATED_PATCH | Freq: Two times a day (BID) | TRANSDERMAL | 1 refills | 90.00 days | Status: CP
Start: 2023-12-02 — End: 2024-05-30
  Filled 2023-12-09: qty 30, 30d supply, fill #0

## 2023-12-02 MED ORDER — MELOXICAM 7.5 MG TABLET
ORAL_TABLET | Freq: Every day | ORAL | 0 refills | 30.00 days | Status: CN | PRN
Start: 2023-12-02 — End: 2024-12-01

## 2023-12-02 MED ORDER — CELECOXIB 100 MG CAPSULE
ORAL_CAPSULE | Freq: Two times a day (BID) | ORAL | 1 refills | 90.00 days | Status: CP
Start: 2023-12-02 — End: 2024-05-30
  Filled 2023-12-09: qty 360, 90d supply, fill #0

## 2023-12-02 MED ORDER — PREGABALIN 150 MG CAPSULE
ORAL_CAPSULE | Freq: Two times a day (BID) | ORAL | 1 refills | 90.00 days | Status: CP
Start: 2023-12-02 — End: 2024-05-30
  Filled 2023-12-09: qty 180, 90d supply, fill #0

## 2023-12-02 NOTE — Unmapped (Signed)
[place] [facility] at [facility]     Type of visit: video    Are you located in [place]? (for virtual visits only) Yes    Reason for visit: Follow up          HCDM reviewed and updated in Epic:    We are working to make sure all of our patients??? wishes are updated in Epic and part of that is documenting a Environmental health practitioner for each patient  A Health Care Decision Maker is someone you choose who can make health care decisions for you if you are not able - who would you most want to do this for you????  is already up to date.    HCDM (patient stated preference): Kristy Garcia,Kristy Garcia - Mother - [phone]                        __________________________________________________________________________________________

## 2023-12-02 NOTE — Unmapped (Signed)
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    Red Flag Features:    Location of HA over temporal artery, only on the right side    Blurred vision    Dizziness    Rapid onset    Progressive in course    Severity is worst HA in life    Positional worsening, bending over, laying flat    Residual right temple sensitivity, feels funny still  Minimally responsive to medications    Sudden onset    Demographics increase suspicion      - female     - age >65 y.o.    * note, I did not ask about jaw claudication      Pertinent negatives which would otherwise explain certain red flag features:    Absence of photophobia and phonophobia    No sinus pressure or tenderness reported nor on physical exam (which was limited d/t video visit, but I did have her palpate her frontal and maxillary sinuses)  PLAN:   Ordered labs:  ESR  CRP    * R/O GCA; Only 4% of GCA patients have both ESR and CRP in the normal range  CK  CMP  Urinalysis w/microscopy  SPEP  Bone profile panel: calcium, phosphorus, albumin, total protein, vitamin D Complete blood count (CBC)    *R/O monoclonal gammopathy.  + unintentional weight loss, + bone pain, + arthralgia  LFT's  Direct bilirubin    Chat message sent to PCP, Dr. Marguerita Beards re: s/s who advises:  - Same day clinic appt next week   - Wait to start steroids     Follow-up in same day clinic ASAP.  High priority message sent via in-basket to [facility] Triage Nursing Dutch Quint and Back-End Operations to facilitate scheduling.     * REPORT TO EMERGENCY DEPARTMENT IMMEDIATELY FOR SYMPTOM RECURRENCE *  Reviewed RTC precautions.      2) Chronic pain/diffuse arthralgia    ASSESSMENT:  - Patient's chronic pain is not controlled.  - Benefits and risks of ongoing treatment reviewed.   - Benefits>risks    PLAN    ORDERED:  - naloxone (NARCAN) 4 mg nasal spray; One spray in either nostril once for known/suspected opioid overdose. May repeat every 2-3 minutes in alternating nostril til EMS arrives  Dispense: 2 each; Refill: 0    CONTINUE  - lidocaine (LIDODERM) 5 % patch; Apply 1 patch to affected area for 12 hours only each day (then remove patch)  Dispense: 180 patch; Refill: 1   - duloxetine (CYMBALTA) 60MG , daily   - diclofenac gel   - tizanidine, 2mg  every 8 hours as needed    INCREASE:  - celecoxib (CELEBREX) 100 MG capsule; Take 2 capsules (200 mg total) by mouth two (2) times a day.  Dispense: 360 capsule; Refill: 1    * increased from 100mg  BID  - pregabalin (LYRICA) 150  MG capsule; Take 1 capsule (150 mg total) by mouth two (2) times a day.  Dispense: 180 capsule; Refill: 1    FOLLOW-UP in-person, in this clinic in 4 weeks, or sooner should chronic pain worsen or fail to improve    I personally spent 110 minutes face-to-face and non-face-to-face in the care of this patient, which includes all pre, intra, and post visit time on the date of service.  All documented time was specific to the E/M visit and does not include any procedures that may have been performed.    Subjective:     REASON FOR VISIT:  Management of chronic pain and monitoring of medication safety.    [id number] Kristy Garcia is a 58 y.o. female with a PMH of cervical retrolisthesis (C5/C6) and facet arthropathy, polysubstance use disorder, depression, and anxiety who presented to [facility] for subacute, right-sided, temporal headache and widespread, bilateral arthralgia and myalgia.   - She has family history of RA and at last visit, we drew labs to rule out inflammatory/rheumatologic pathology last visit (ESR, CRP, RF, ANA).   - These were mostly unremarkable with the exception of positive ANA, 1:80 speckled pattern, which is of little clinical significance on its own.     She describes subacute HA which started ,     I am just having lots of pain.    I had this headache on Tuesday and Wednesday that was unreal.  It took a long time to go away.  At first I thought I was having a migraine.   Temple and all.  My head was hurting so bad... it really scared me...  I was getting sharp pains on the temple on my right side  (+ blurred vision, +dizziness)  I have had vertigo before but this was different.  I am still a little dizzy to be honest and my temple still feels funny. I have never had a headache like this before.    I took every pain medicine I had, and I took my blood pressure medicines too in case it was high blood pressure.  It took a long time to go away.     I woke up the next day and it was still lingering.       It felt like my eyeball was going to pop out of the socket.      I didn't notice much change in sensation of my extremitities but did have a funny sensation in my wrist.    She describes her chronic pain regimen as follows:       I take Tizanidine helps as needed.  Not every day. If I am in really bad pain I take a Lyrica in the morning and gabapentin at night.  I haven't taken anything today yet.      AT LAST PAIN VISIT: 06/19/2024    - administered 40mg  IM solumedrol  - refilled gabapentin and lyrica with instruction to cross taper  - ordered ESR, RF, CRP - all normal.  ANA positive 1:80 speckled pattern of minimal clinical significance without other positive findings    SINCE LAST PAIN VISIT: 06/19/2024  - Pain is worse  - She was not able to tolerate levetiracetam due s/e headache  - Mood has been stable      PAIN GENERATOR 1: Ankles (bilateral), feet (bilateral), knees (bilateral)  Character:  throbbing, stabbing, ripping, painful  Distribution:  Moves upward in a continuous line from bilateral ankles and feel  to bilateral knees, hips, shoulders, and neck  Frequency:  constant  - not associated with weakness, tingling, numbness    PAIN GENERATOR 2: Elbows (bilateral), hands (bilateral), MCP joints (bilateral)  Character:  painful  Frequency:  constant    GOALS: include: complete resolution of the pain and no medications and become more active with the family.  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    Patient reports she is taking medications as prescribed.  Patient reports poor overall analgesia from the current medication regimen.  Patient reports that the current medications do help to improve the quality of life and level of daily function.  Patient reports being able to tolerate the current pain medications well.  Patient reports being able to perform the majority of ADLs on the current medication regimen.'    Benefits of Medications:  - Improved function with IADL's  - Improved sleep  - Improved mood    Risks of Medications:  - Overdose  - Death  - Constipation  - Addiction  - Tolerance  - Constipation  - Worsening of chronic pain condition over time    Adverse Effects of Analgesic Medications:  Constipation: NO  Sedation: NO (not with current dosing schedule)  Cognitive impairment: NO  Falls: NO      ROS    Review of Systems   Constitutional:  Positive for malaise/fatigue and weight loss. Negative for chills and fever.        5 pounds over the last 6 months   HENT: Negative.     Eyes:  Positive for blurred vision and pain.   Respiratory: Negative.     Cardiovascular: Negative.    Gastrointestinal: Negative.    Genitourinary: Negative.    Musculoskeletal:  Positive for joint pain, myalgias and neck pain.   Skin:  Negative for itching and rash.   Neurological:  Positive for dizziness, weakness and headaches.   Psychiatric/Behavioral: Negative.       The review of systems is negative except what was stated elsewhere in th echarty.     Objective:     Vitals:     PHYSICAL EXAM (limited by video visit)  General: Kristy Garcia is alert and in NAD.  - Appears calm and relaxed  HEENT   Head/face: No trauma. No visible or palpable masses.  Eyes: Pupils are equal, round and roughly 3-47mm. Clear conjunctiva, white, non-injected sclera. No lid lag.  Neck:    - There is not neck stiffness, rigidity, or decreased ROMThere is not trapezius muscle hypertrophy  Pulmonary: breathing comfortably on room air  MSK: Ambulates throughout home without difficulty  Neuro: alert, and oriented x 4.  - Facial expressions are symmetrical   - Speech is fluent   Psych:   - Mood and affect are congruent   - Thought content is logical   - Speech cadence is fluent   - She brightens with conversation       Records Review:

## 2023-12-03 MED ORDER — DICLOFENAC 1 % TOPICAL GEL
Freq: Four times a day (QID) | TOPICAL | 5 refills | 57.00 days | Status: CP
Start: 2023-12-03 — End: 2024-12-02

## 2023-12-05 NOTE — Unmapped (Unsigned)
[facility] Specialty and Home Delivery Pharmacy Refill Coordination Note    Specialty Medication(s) to be Shipped:   Infectious Disease: Biktarvy    Other medication(s) to be shipped: diclofenac sodium 1 % gel (VOLTAREN),     Kristy Garcia Numbers, DOB: 28-Jun-1966  Phone: There are no phone numbers on file.      All above HIPAA information was verified with patient.     Was a Nurse, learning disability used for this call? No    Completed refill call assessment today to schedule patient's medication shipment from the [facility] and Home Delivery Pharmacy  [phone]).  All relevant notes have been reviewed.     Specialty medication(s) and dose(s) confirmed: Regimen is correct and unchanged.   Changes to medications: [name] reports no changes at this time.  Changes to insurance: No  New side effects reported not previously addressed with a pharmacist or physician: None reported  Questions for the pharmacist: No    Confirmed patient received a Conservation officer, historic buildings and a Surveyor, mining with first shipment. The patient will receive a drug information handout for each medication shipped and additional FDA Medication Guides as required.       DISEASE/MEDICATION-SPECIFIC INFORMATION        N/A    SPECIALTY MEDICATION ADHERENCE     Medication Adherence    Patient reported X missed doses in the last month: 0  Specialty Medication: Biktarvy 50-200-25mg  QD  Patient is on additional specialty medications: No  Informant: patient              Were doses missed due to medication being on hold? No    BIKTARVY 50-200-25 mg tablet (bictegrav-emtricit-tenofov ala): 5 days of medicine on hand       REFERRAL TO PHARMACIST     Referral to the pharmacist: Not needed      [facility]     Shipping address confirmed in Epic.       Delivery Scheduled: Yes, Expected medication delivery date: 12/09/23.     Medication will be delivered via UPS to the temporary address in Epic WAM.    Kristy Garcia   [facility] Specialty and Home Delivery Pharmacy  Specialty Technician

## 2023-12-09 ENCOUNTER — Ambulatory Visit: Admit: 2023-12-09 | Discharge: 2023-12-10 | Payer: PRIVATE HEALTH INSURANCE

## 2023-12-09 DIAGNOSIS — R519 Right sided temporal headache: Principal | ICD-10-CM

## 2023-12-09 DIAGNOSIS — G894 Chronic pain syndrome: Principal | ICD-10-CM

## 2023-12-09 DIAGNOSIS — M5441 Lumbago with sciatica, right side: Principal | ICD-10-CM

## 2023-12-09 DIAGNOSIS — Z79899 Other long term (current) drug therapy: Principal | ICD-10-CM

## 2023-12-09 DIAGNOSIS — H538 Other visual disturbances: Principal | ICD-10-CM

## 2023-12-09 DIAGNOSIS — M2550 Pain in unspecified joint: Principal | ICD-10-CM

## 2023-12-09 DIAGNOSIS — G8929 Other chronic pain: Principal | ICD-10-CM

## 2023-12-09 DIAGNOSIS — M25572 Pain in left ankle and joints of left foot: Principal | ICD-10-CM

## 2023-12-09 DIAGNOSIS — M5442 Lumbago with sciatica, left side: Principal | ICD-10-CM

## 2023-12-09 LAB — URINALYSIS WITH MICROSCOPY
BILIRUBIN UA: NEGATIVE
BLOOD UA: NEGATIVE
GLUCOSE UA: NEGATIVE
KETONES UA: NEGATIVE
LEUKOCYTE ESTERASE UA: NEGATIVE
NITRITE UA: NEGATIVE
PH UA: 5.5 (ref 5.0–9.0)
RBC UA: 3 /HPF (ref <=4)
SPECIFIC GRAVITY UA: 1.028 (ref 1.003–1.030)
SQUAMOUS EPITHELIAL: 2 /HPF (ref 0–5)
UROBILINOGEN UA: <2
WBC UA: 1 /HPF (ref 0–5)

## 2023-12-09 LAB — COMPREHENSIVE METABOLIC PANEL
ALBUMIN: 4.2 g/dL (ref 3.4–5.0)
ALKALINE PHOSPHATASE: 81 U/L (ref 46–116)
ALT (SGPT): 27 U/L (ref 10–49)
ANION GAP: 11 mmol/L (ref 5–14)
AST (SGOT): 25 U/L (ref <=34)
BILIRUBIN TOTAL: 0.4 mg/dL (ref 0.3–1.2)
BLOOD UREA NITROGEN: 13 mg/dL (ref 9–23)
BUN / CREAT RATIO: 16
CALCIUM: 9.8 mg/dL (ref 8.7–10.4)
CHLORIDE: 103 mmol/L (ref 98–107)
CO2: 26.4 mmol/L (ref 20.0–31.0)
CREATININE: 0.79 mg/dL (ref 0.55–1.02)
EGFR CKD-EPI (2021) FEMALE: 87 mL/min/{1.73_m2} (ref >=60)
GLUCOSE RANDOM: 96 mg/dL (ref 70–179)
POTASSIUM: 3.8 mmol/L (ref 3.4–4.8)
PROTEIN TOTAL: 7.8 g/dL (ref 5.7–8.2)
SODIUM: 140 mmol/L (ref 135–145)

## 2023-12-09 LAB — HEPATIC FUNCTION PANEL
ALBUMIN: 4.2 g/dL (ref 3.4–5.0)
ALKALINE PHOSPHATASE: 81 U/L (ref 46–116)
ALT (SGPT): 27 U/L (ref 10–49)
AST (SGOT): 25 U/L (ref <=34)
BILIRUBIN DIRECT: 0.1 mg/dL (ref 0.00–0.30)
BILIRUBIN TOTAL: 0.4 mg/dL (ref 0.3–1.2)
PROTEIN TOTAL: 7.8 g/dL (ref 5.7–8.2)

## 2023-12-09 LAB — C-REACTIVE PROTEIN: C-REACTIVE PROTEIN: <4 mg/L (ref <=10.0)

## 2023-12-09 LAB — BILIRUBIN, DIRECT: BILIRUBIN DIRECT: 0.1 mg/dL (ref 0.00–0.30)

## 2023-12-09 LAB — PHOSPHORUS: PHOSPHORUS: 3.1 mg/dL (ref 2.4–5.1)

## 2023-12-09 LAB — CK: CREATINE KINASE TOTAL: 92 U/L (ref 34.0–145.0)

## 2023-12-09 LAB — SEDIMENTATION RATE: ERYTHROCYTE SEDIMENTATION RATE: 7 mm/h (ref 0–30)

## 2023-12-09 MED FILL — AMLODIPINE 10 MG TABLET: ORAL | 90 days supply | Qty: 90 | Fill #1

## 2023-12-09 MED FILL — PRAVASTATIN 40 MG TABLET: ORAL | 90 days supply | Qty: 90 | Fill #1

## 2023-12-09 MED FILL — DULOXETINE 60 MG CAPSULE,DELAYED RELEASE: ORAL | 90 days supply | Qty: 90 | Fill #1

## 2023-12-09 MED FILL — OLMESARTAN 20 MG TABLET: ORAL | 90 days supply | Qty: 90 | Fill #1

## 2023-12-09 MED FILL — SPIRIVA WITH HANDIHALER 18 MCG AND INHALATION CAPSULES: RESPIRATORY_TRACT | 30 days supply | Qty: 30 | Fill #0

## 2023-12-09 MED FILL — SYMBICORT 80 MCG-4.5 MCG/ACTUATION HFA AEROSOL INHALER: RESPIRATORY_TRACT | 30 days supply | Qty: 10.2 | Fill #0

## 2023-12-09 MED FILL — BIKTARVY 50 MG-200 MG-25 MG TABLET: ORAL | 30 days supply | Qty: 30 | Fill #4

## 2023-12-09 MED FILL — VARENICLINE TARTRATE 1 MG TABLET: ORAL | 28 days supply | Qty: 56 | Fill #1

## 2023-12-09 MED FILL — METHIMAZOLE 5 MG TABLET: ORAL | 30 days supply | Qty: 30 | Fill #1

## 2023-12-09 MED FILL — QUETIAPINE 25 MG TABLET: 90 days supply | Qty: 90 | Fill #1

## 2023-12-09 MED FILL — FLUTICASONE PROPIONATE 50 MCG/ACTUATION NASAL SPRAY,SUSPENSION: NASAL | 60 days supply | Qty: 16 | Fill #1

## 2023-12-09 NOTE — Unmapped (Signed)
Left message with patient advising them to call same day imaging at [phone] to get appt for the scan.

## 2023-12-09 NOTE — Unmapped (Addendum)
Thank you for coming to see [place] today in clinic!    We are going to work up your headache with some blood work (inflammatory markers) and a CT of your head! Based on those results, I will be in contact with you regarding next steps.     Please continue to stay hydrated, sleep well, avoid stress.     Best,   Dr. Christen Bame

## 2023-12-09 NOTE — Unmapped (Signed)
[place] [facility] at [facility]     Reason for visit: Follow up    Questions / Concerns that need to be addressed: Been having real bad headache and right ear pain.    Screening BP- 126/63 P 66    Omron BPs (complete if screening BP has a systolic  > 130 or diastolic > 80)  BP#1    BP#2   BP#3     Average BP   (please note this as a comment in vitals)     PTHomeBP     Diabetes:  Regularly checking blood sugars?: no  If yes, when? Complete log for past 7 days  Date Before Breakfast After Breakfast Before Lunch After Lunch Before Dinner After Dinner Before Bed                                                                                                                                   HCDM reviewed and updated in Epic:    We are working to make sure all of our patients??? wishes are updated in Epic and part of that is documenting a Environmental health practitioner for each patient  A Health Care Decision Maker is someone you choose who can make health care decisions for you if you are not able - who would you most want to do this for you????  is already up to date.    HCDM (patient stated preference): Kristy Garcia,Kristy Garcia - Mother - [phone]    BPAs completed:  N/A     Annual Screenings:   Tobacco  __________________________________________________________________________________________    SCREENINGS COMPLETED IN FLOWSHEETS      AUDIT       PHQ2       PHQ9          GAD7       COPD Assessment       Falls Risk

## 2023-12-09 NOTE — Unmapped (Signed)
Internal Medicine Clinic Visit    Reason for visit:     A/P:         1. Right sided temporal headache    2. Chronic pain syndrome    3. Chronic pain of left ankle    4. Arthralgia of multiple sites, bilateral    5. Chronic midline low back pain with bilateral sciatica    6. Blurred vision    7. Encounter for long-term current use of high risk medication        58 yo F PMHx of Controlled HIV, HTN, chronic back pain with bilateral sciatica, osteoarthritis of both hands, TUD, HLD, hx of cocaine use presenting for severe right-sided headache.    Severe Right - Sided Temporal Headache  Endorsed having this headache for at least two weeks, maybe a month. Saw chronic pain provider and workup was initiated during that time, but patient was not able to get them done. About a week ago was when it was at its worst. No recent use of illicit substances. No fevers or chills.   No meningismus signs on exam. No ear infection. No signs of sinusitis infection exam as well. No overlying rash. Bright light tends to bother her eye. No worsening of symptoms when lying down or sneezing. No change in hearing. No lacrimation. However, does endorse maybe some blurry of vision, that was worse during acute episode, but now back to baseline. No fevers or chills, but does have tenderness to temporal palpation. With constellation of symptoms, concerning for possible GCA. Could consider migraine as well, like a temporal migraine, but want to rule out GCA. Didn't have tearing or excess autonomic disturbance to suggest a cluster headache. No signs of TMJ on exam as well. Did endorse history of heavy caffeine intake as well (coffee + energy drinks) which she did cut back on two weeks ago, so may have some headaches from withdrawal, but also wouldn't expect tenderness to palpation. Reassuring headache is not getting worse, and does feel better today.   - ESR , CRP ordered  - CT head ordered   - can continue right now with tylenol PRN to help pain (taking less than 3 grams a day), continues to avoid NSAIDS    Patient Name: Kristy Garcia  Facility tax id #: [id number]        MRN: [id number]   Attending Provider Name: Altamease Oiler Cletis [employer]*    Attending Provider NPI: [id number]  SD[facility]/IM staff name and contact # for questions: Julious Payer  Please scan completed form to: [email or web address]  What is the test/procedure being requested? CT head without contract/ CPT code -95284   ICD 10 diagnosis code: R51.9  List all relevant symptoms and how long each of them has been going on? Right-sided headache in immunocompromised patient  What is suspected or what are you trying to rule out? Rule out Malignancy, infection, bleed  List dates of recent x-rays or prior imaging for this issue? N/A   What is the date of the last visit with supportive notes in Epic? 12/08/22    HTN: Currently on amlodipine 10 mg daily, olmesartan 20 mg daily. Well controlled.  - can continue current regimen.     Return in about 1 week (around 12/16/2023) for PCP .    Staffed with Dr. Altamease Oiler, discussed    __________________________________________________________    HPI:    Has been having sharp pains running through right sight side of her head for years. Feels like the last headache  was unbearable as needed a lot of pain medication to help her go to sleep.     Happened last week, not sure which day, but it lasted all day and all night. Needed to take Lyrica, duloxetine, sinus pillls, allergy pills, and a muscle relaxer to fully help with the pain. Not sure which one specifically helped, but all of it seemed to help, but still lingering for the past two days. When having the head felt like left forearm got numb (wrist). Also endorsed having dizzines, and blurry vision. No ears in eyes. However, vision back to normal right now.      No fevers or chills. No rhinorrhea. Doesn't feel like she is congested. Doesn't take any nsaid or ibuprofen use anymore. Has tried tylenol, flexeril, lyrica for combined use to improve her headache, which it did help. However, still endorses having some lingering pain still present.     Smoking about a pack a week, and trying to improve. No other recent illicit substance. Not taking any other supplements.     __________________________________________________________        Medications:  Reviewed in EPIC  __________________________________________________________    Physical Exam:   Vital Signs:  Vitals:    12/09/23 1042   BP: 126/63   BP Site: L Arm   BP Position: Sitting   BP Cuff Size: Large   Pulse: 66   Resp: 18   Temp: 36.3 ??C (97.4 ??F)   TempSrc: Temporal   SpO2: 99%   Weight: 73.5 kg (162 lb)   Height: 162.6 cm (5' 4)          PTHomeBP    The patient???s Average Home Blood Pressure during the last two weeks is :   /   based on  readings      Gen: Well appearing currently, NAD  HEENT: PERRL bilaterally, no conjunctival injection  CV: RRR, no murmurs  Pulm: CTA bilaterally, no crackles or wheezes    PHQ-9 Score:     GAD-7 Score:       Medication adherence and barriers to the treatment plan have been addressed. Opportunities to optimize healthy behaviors have been discussed. Patient / caregiver voiced understanding.

## 2023-12-10 LAB — MONOCLONAL GAMMOPATHY CHEMISTRIES
GAMMAGLOBULIN; IGA: 245.3 mg/dL (ref 70.0–400.0)
GAMMAGLOBULIN; IGG: 1426 mg/dL (ref 650–1600)
GAMMAGLOBULIN; IGM: 33 mg/dL — ABNORMAL LOW (ref 40–230)
PROTEIN TOTAL (SPECIAL CHEM): 7.6 g/dL (ref 5.7–8.2)

## 2023-12-13 ENCOUNTER — Inpatient Hospital Stay: Admit: 2023-12-13 | Discharge: 2023-12-14 | Payer: PRIVATE HEALTH INSURANCE

## 2023-12-13 LAB — MONOCLONAL GAMMOPATHY WORKUP, SERUM
ALBUMIN (SPE): 4.5 g/dL (ref 3.5–5.0)
ALPHA-1 GLOBULIN: 0.3 g/dL (ref 0.2–0.5)
ALPHA-2 GLOBULIN: 0.7 g/dL (ref 0.5–1.1)
BETA-1 GLOBULIN: 0.4 g/dL (ref 0.3–0.6)
BETA-2 GLOBULIN: 0.4 g/dL (ref 0.2–0.6)
GAMMAGLOBULIN: 1.2 g/dL (ref 0.5–1.5)
PROTEIN TOTAL (SPECIAL CHEM): 7.6 g/dL

## 2023-12-14 LAB — SERUM FREE LIGHT CHAINS
K/L FLC RATIO: 1.24 (ref 0.26–1.65)
KAPPA FREE,SERUM: 1.89 mg/dL (ref 0.33–1.94)
LAMBDA FREE, SER: 1.53 mg/dL (ref 0.57–2.63)

## 2023-12-14 LAB — VITAMIN D 25 HYDROXY: VITAMIN D, TOTAL (25OH): 34 ng/mL (ref 20.0–80.0)

## 2023-12-18 NOTE — Unmapped (Unsigned)
Internal Medicine Clinic Visit  Phone Visit    Reason for visit: headache    A/P:    Chronic migraine without aura without status migrainosus, not intractable  -   Pt has chronic headache. Is one sided and has photophobia. Neg ESR/CRP, head CT without contrast: normal. BP: normal  Imp: migraine vs TMJ vs trigeminal neuralgia vs cluster headache. Pt has appt to see me next week.   Plan: trial of triptan, follow up in person next week   rizatriptan; Take 1 tablet (10 mg total) by mouth once as needed for migraine. May repeat in 2 hours if needed         Health Maintenance:   Health Maintenance   Topic Date Due    Mammogram  09/17/2021    COVID-19 Vaccine (6 - 2024-25 season) 07/24/2023    Lung Cancer Screening Shared Decision Making  12/24/2023    Pap Smear with Cotest HPV (21-65)  03/31/2024    Serum Creatinine Monitoring  12/08/2024    Potassium Monitoring  12/08/2024    DTaP/Tdap/Td Vaccines (2 - Td or Tdap) 05/03/2026    HPV Cotest with Pap Smear (21-65)  08/18/2026    Colon Cancer Screening  12/31/2026    COPD Spirometry  12/18/2028    Pneumococcal Vaccine 50+  Completed    Hepatitis C Screen  Completed    Influenza Vaccine  Completed    Zoster Vaccines  Completed         Return for Next scheduled follow up.  Next Visit:   Evaluated headache  __________________________________________________________    HPI:  Pt has chronic pain, HIV, HTN, depression and tobacco use   Worked up for headache-neg ESR,   Still has headaches-thinks it might be a sinus headache. One one side of head, right side. Has them about every day. Gets a sharp pain in her head and her eye starts bothering her. No nausea, some photophobia.   Taking OTC sinus and allergy, which don't help.   Still taking duloxetine. Did have migraines before.   ----  BP Readings from Last 3 Encounters:   12/19/23 124/64   12/19/23 116/65   12/09/23 126/63     BP is good.     PTHomeBP   Lab Results   Component Value Date    ACD4 1,378 06/03/2023    CD4 53 06/03/2023    HIVCP <20 07/22/2023    HIVRS Not Detected 06/03/2023       __________________________________________________________    Problem List:  Patient Active Problem List   Diagnosis    Human immunodeficiency virus (HIV) disease (CMS-HCC)    Hyperthyroidism    Suicide and self-inflicted injury (CMS-HCC)    Tobacco use disorder    Cocaine use disorder, mild, in sustained remission, abuse (CMS-HCC)    Alcohol use disorder, moderate, in sustained remission (CMS-HCC)    HIV (human immunodeficiency virus infection) (CMS-HCC)    Essential hypertension (RAF-HCC)    Hyperlipidemia    Osteopenia    Sciatica of right side    Cervical dysplasia    Joint pain in both hands    Chronic pain of left ankle    Arthralgia of multiple sites, bilateral    Recurrent major depressive disorder, in partial remission (CMS-HCC)    Colon cancer screening    Chronic midline low back pain with bilateral sciatica    Primary osteoarthritis of both hands    Chronic pain syndrome    Encounter for long-term (current) use of high-risk medication  Myalgia, multiple sites    Centrilobular emphysema (CMS-HCC)    Sudden onset of severe headache    Blurred vision    Right sided temporal headache    Subacute frontal sinusitis       Medications:  Reviewed in EPIC    _________________________________________________________    Physical Exam:   Vital Signs:  Vitals:     Wt Readings from Last 3 Encounters:   12/19/23 73.8 kg (162 lb 11.2 oz)   12/19/23 73.8 kg (162 lb 9.6 oz)   12/09/23 73.5 kg (162 lb)     Gen:  No distress  Records review  Last   Lab Results   Component Value Date    CREATININE 0.79 12/09/2023    CHOL 138 08/18/2023    HDL 40 08/18/2023    LDL 66 08/18/2023    NONHDL 98 08/18/2023    TRIG 161 (H) 08/18/2023    A1C 6.0 (H) 08/18/2023        The ASCVD Risk score (Arnett DK, et al., 2019) failed to calculate.   Medication adherence and barriers to the treatment plan have been addressed. Opportunities to optimize healthy behaviors have been discussed. Patient / caregiver voiced understanding.           The patient reports they are physically located in [place] and is currently: at home. I conducted a phone visit.  I spent 8 minutes on the phone call with the patient on the date of service . Medication adherence and barriers to the treatment plan have been addressed. Opportunities to optimize healthy behaviors have been discussed. Patient / caregiver voiced understanding.    {TIP - [facility]- RAFF Pilot- Clinical Documentation Specialist Recommendations-  No specialty comments available.   This text will self delete upon signing note:[id number]}     I personally spent *** minutes face-to-face and non-face-to-face in the care of this patient, which includes all pre, intra, and post visit time on the date of service.

## 2023-12-19 ENCOUNTER — Inpatient Hospital Stay: Admit: 2023-12-19 | Discharge: 2023-12-19 | Payer: PRIVATE HEALTH INSURANCE

## 2023-12-19 ENCOUNTER — Ambulatory Visit: Admit: 2023-12-19 | Discharge: 2023-12-19 | Payer: PRIVATE HEALTH INSURANCE

## 2023-12-19 ENCOUNTER — Ambulatory Visit: Admit: 2023-12-19 | Discharge: 2023-12-19 | Payer: PRIVATE HEALTH INSURANCE | Attending: Family | Primary: Family

## 2023-12-19 DIAGNOSIS — J0110 Acute frontal sinusitis, unspecified: Principal | ICD-10-CM

## 2023-12-19 DIAGNOSIS — R0602 Shortness of breath: Principal | ICD-10-CM

## 2023-12-19 DIAGNOSIS — B20 Human immunodeficiency virus [HIV] disease: Principal | ICD-10-CM

## 2023-12-19 DIAGNOSIS — F17200 Nicotine dependence, unspecified, uncomplicated: Principal | ICD-10-CM

## 2023-12-19 DIAGNOSIS — J432 Centrilobular emphysema: Principal | ICD-10-CM

## 2023-12-19 DIAGNOSIS — Z23 Encounter for immunization: Principal | ICD-10-CM

## 2023-12-19 DIAGNOSIS — K08109 Complete loss of teeth, unspecified cause, unspecified class: Principal | ICD-10-CM

## 2023-12-19 DIAGNOSIS — J449 Chronic obstructive pulmonary disease, unspecified: Principal | ICD-10-CM

## 2023-12-19 DIAGNOSIS — Z9189 Other specified personal risk factors, not elsewhere classified: Principal | ICD-10-CM

## 2023-12-19 DIAGNOSIS — Z5181 Encounter for therapeutic drug level monitoring: Principal | ICD-10-CM

## 2023-12-19 DIAGNOSIS — Z79899 Other long term (current) drug therapy: Principal | ICD-10-CM

## 2023-12-19 MED ORDER — TRELEGY ELLIPTA 100 MCG-62.5 MCG-25 MCG POWDER FOR INHALATION
Freq: Every day | RESPIRATORY_TRACT | 11 refills | 30 days | Status: CP
Start: 2023-12-19 — End: 2024-12-18

## 2023-12-19 MED ORDER — NICOTINE 7 MG/24 HR DAILY TRANSDERMAL PATCH
MEDICATED_PATCH | TRANSDERMAL | 0 refills | 14.00 days | Status: CP
Start: 2023-12-19 — End: 2024-01-02

## 2023-12-19 MED ORDER — BIKTARVY 50 MG-200 MG-25 MG TABLET
ORAL_TABLET | Freq: Every day | ORAL | 11 refills | 30.00 days | Status: CP
Start: 2023-12-19 — End: ?
  Filled 2024-01-09: qty 30, 30d supply, fill #0

## 2023-12-19 MED ORDER — NICOTINE (POLACRILEX) 2 MG GUM
BUCCAL | 3 refills | 5.00 days | Status: CP | PRN
Start: 2023-12-19 — End: 2024-12-18

## 2023-12-19 NOTE — Unmapped (Signed)
HIV-PULMONARY CLINIC VISIT:    Patient: Kristy Garcia(1966-08-18)   Reason for visit: COPD    HISTORY OF PRESENT ILLNESS:     Patient is a 58 year old lady with a PMH as below including fibromyalgia, hyperthyroidism, MDD, HIV on ART, and COPD who presents today for evaluation and treatment of her COPD and tobacco use.     HISTORY OF PRESENT ILLNESS (07/2023):   Kristy Garcia reports that she believes she was diagnosed with COPD years ago, but was started on Symbicort within the last year. She currently uses that - 2 puffs once a day. Spiriva was added to her regimen about 3-4 months ago - 1 puff once a day. She notes that prior to starting these inhalers, she struggled with walking up a flight of stairs; however, she can now climb them without dyspnea. She also reports a significant decrease in her daily cough though she still has it occasionally; it is typically productive of clear sputum without blood. She does still have intermittent wheezing, which is more frequent at night. She reports infrequent use of her rescue albuterol, once every few days. She denies any chest pain, fevers, chills, or lower extremity swelling. She has required at least 3 course of oral prednisone for her breathing in the past year, that she can recall. She denies any history of childhood asthma or respiratory illnesses. She does report that breathing issues are often triggered by very strong smells such as perfume, and very hot or very cold temperatures. She does have seasonal allergies in the spring and summer - she manages these with an oral antihistamine, tylenol sinus, and flonase. She reports that she very rarely snores and has never been told that she gasps or chokes in her sleep. She generally wakes up well-rested but does need to take naps during the day. She denies any morning headaches. She does have uncontrolled acid reflux 3-4 times a week and was previously on a PPI but has not had it recently due to cost.     INTERVAL HISTORY (11/2023):  Kristy Garcia reports that she is overall doing well today. She reports that her dyspnea on exertion is stable from her last visit and her mMRC score today is 2. She does have a dry cough that has not increased in frequency. She denies chest pain, chest tightness, fevers, chills or lower extremity swelling. At present, she is using her Symbicort inhaler once a day, her albuterol inhaler once a day and her Spiriva inhaler as needed, typically 1-2 times a week. She reports that she has had R sided sinus pressure with headache (has been seen in IM clinic for evaluation) for the past three weeks with occasional nasal drainage, despite using her Flonase daily. She has not had any respiratory infections or required any courses of prednisone since her last visit. She has continued to cut down on smoking and is currently smoking 1 pack per week.     Medication Adherence:  Currently using incorrectly  Inhaler Technique: Using inhaler properly.    Pulmonary medications: Spiriva, Symbicort, albuterol    HIV status:  Date: CD4 Viral load   03/2021 1248 ND   07/2022 1643 ND   06/2023 1355 <20     Current regimen: Biktarvy    REVIEW OF SYSTEMS: All systems were reviewed and found to be negative except as above in the HPI.    PAST MEDICAL HISTORY:     MEDICAL/SURGICAL HISTORY:  Past Medical History:   Diagnosis Date  Asthma     Bronchitis     Chest pain 01/23/2018    Dental caries     Depression     Depressive disorder 09/27/2013    R/o substance induced mood disorder    Fibromyalgia     HIV (human immunodeficiency virus infection) (CMS-HCC)     HLD (hyperlipidemia)     Hypertension     Hypothyroidism     Palpitations 01/23/2018    Recurrent Candidal vulvovaginitis 03/11/2014    Reflux      Past Surgical History:   Procedure Laterality Date    DILATION AND CURETTAGE OF UTERUS      EYE SURGERY Left     PR COLONOSCOPY FLX DX W/COLLJ SPEC WHEN PFRMD N/A 12/31/2016    Procedure: COLONOSCOPY, FLEXIBLE, PROXIMAL TO SPLENIC FLEXURE; DIAGNOSTIC, W/WO COLLECTION SPECIMEN BY BRUSH OR WASH;  Surgeon: Andrey Farmer, MD;  Location: [facility];  Service: Gastroenterology    PR REPAIR ING HERNIA,5+Y/O,STRANG Left 04/17/2019    Procedure: PRIORITY REPAIR INITIAL INGUINAL HERNIA, AGE 51 YEARS OR OLDER; INCARCERATED OR STRANGULATED;  Surgeon: Letha Cape, MD;  Location: [facility] OR [facility];  Service: General Surgery       SOCIAL AND FAMILY HISTORY:   Social history: She is currently living with a friend temporarily and is applying for TCL housing. There are no pets in the home. 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 She started smoking at age 73 and at most, smoked 1 pack per day (42 pack-years). She has been using Chantix intermittently over the past 6 months and with this, has been able to cut down to 7-8 cpd. She reports no cravings with the Chantix. She reports prior inhaled cocaine use, last 10-20 years ago. She is currently smoking 2-5 cpd.     Family History: Mother, brother and son all have asthma. Colon cancer runs on both sides of the family.     MEDICATIONS AND ALLERGIES:     CURRENT MEDICATIONS:  Outpatient Encounter Medications as of 12/19/2023   Medication Sig Dispense Refill    acetaminophen (TYLENOL 8 HOUR) 650 MG CR tablet Take 2 tablets (1,300 mg total) by mouth in the morning.      albuterol HFA 90 mcg/actuation inhaler Inhale 2 puffs every four (4) hours as needed for wheezing. 18 g 2    amlodipine (NORVASC) 10 MG tablet Take 1 tablet (10 mg total) by mouth daily. 90 tablet 3    aripiprazole (ABILIFY) 2 MG tablet Take 1 tablet (2 mg total) by mouth every morning.      B-complex with vitamin C (TOTAL B W/C) tablet Take 1 tablet by mouth daily. 30 tablet 12    bictegrav-emtricit-tenofov ala (BIKTARVY) 50-200-25 mg tablet Take 1 tablet by mouth daily. 30 tablet 11    budesonide-formoterol (SYMBICORT) 80-4.5 mcg/actuation inhaler Inhale 2 puffs two (2) times a day. 6.9 g 5    celecoxib (CELEBREX) 100 MG capsule Take 2 capsules (200 mg total) by mouth two (2) times a day. 360 capsule 1    diclofenac sodium (VOLTAREN) 1 % gel Apply 2 grams topically four (4) times a day. 450 g 5    DULoxetine (CYMBALTA) 60 MG capsule Take 1 capsule (  60 mg total) by mouth daily. 90 capsule 3    DULoxetine (CYMBALTA) 60 MG capsule Take 1 capsule every day by oral route at bedtime for 90 days. 90 capsule 1    fluticasone propionate (FLONASE) 50 mcg/actuation nasal spray Instill 1 spray into each nostril daily. 16 g 5    lidocaine (LIDODERM) 5 % patch Apply 1 patch to affected area for 12 hours only each day (then remove patch) 180 patch 1    methIMAzole (TAPAZOLE) 5 MG tablet Take 1 tablet (5 mg total) by mouth in the morning. 30 tablet 5    naloxone (NARCAN) 4 mg nasal spray One spray in either nostril once for known/suspected opioid overdose. May repeat every 2-3 minutes in alternating nostril til EMS arrives 2 each 0    olmesartan (BENICAR) 20 MG tablet Take 1 tablet (20 mg total) by mouth daily. 90 tablet 3    omeprazole (PRILOSEC) 20 MG capsule Take 1 capsule (20 mg total) by mouth daily. 90 capsule 3    pravastatin (PRAVACHOL) 40 MG tablet Take 1 tablet (40 mg total) by mouth daily. 90 tablet 3    pregabalin (LYRICA) 150 MG capsule Take 1 capsule (150 mg total) by mouth two (2) times a day. 180 capsule 1    QUEtiapine (SEROQUEL) 25 MG tablet Take 1 full tablet by mouth nightly. 90 tablet 1    tiotropium (SPIRIVA) 18 mcg inhalation capsule Place 1 capsule (18 mcg total) into inhaler and inhale daily. 30 capsule 11    tizanidine (ZANAFLEX) 2 MG tablet Take 1 tablet (2 mg total) by mouth every eight (8) hours as needed. 30 tablet 0    traZODone (DESYREL) 50 MG tablet Take 1 tablet (50 mg total) by mouth nightly. 90 tablet 3    traZODone (DESYREL) 50 MG tablet Take 1-2 tablets by mouth as needed for sleep. 180 tablet 1    varenicline tartrate (CHANTIX) 1 mg tablet Take 1 tablet (1 mg total) by mouth two (2) times a day. 60 tablet 2    [DISCONTINUED] bictegrav-emtricit-tenofov ala (BIKTARVY) 50-200-25 mg tablet Take 1 tablet by mouth daily. 30 tablet 10     No facility-administered encounter medications on file as of 12/19/2023.       ALLERGIES:  Allergies as of 12/19/2023 - Reviewed 12/19/2023   Allergen Reaction Noted    Codeine Nausea And Vomiting, Rash, and Hives 05/17/2013    Ibuprofen  04/17/2019    Adhesive tape-silicones  06/04/2021    Adhesive Itching 12/16/2017       PHYSICAL EXAM:     Vitals:    12/19/23 1126   BP: 124/64   BP Site: L Arm   BP Position: Sitting   BP Cuff Size: Large   Pulse: 81   Temp: 36.5 ??C (97.7 ??F)   TempSrc: Temporal   Weight: 73.8 kg (162 lb 11.2 oz)   Height: 162.6 cm (5' 4)      Body mass index is 27.93 kg/m??.    GENERAL: well nourished, cooperative, no acute distress  EYES: anicteric, noninjected, EOMi  HEENT: Neck supple, trachea midline, moist mucus membranes  CV: Regular rate, normal rhythm, no murmurs/rubs/gallops  PULM: Clear to auscultation bilaterally. No dullness to percussion. Normal excursion. Normal work of breathing.   ABD: Soft, nontender, nondistended.   EXTREMITIES: No digital clubbing. No edema.  SKIN: No rashes, lesions, or skin breakdown. Warm and well perfused.  NEURO: No focal neurologic deficits. Moves all extremities and follows commands.  PSYCH: Well groomed, appropriate mood and affect, good eye contact.       ANCILLARY DATA:     Date: FVC (% Pred) FEV1 (% Pred)  Pre-BD FEV1 (%Pred)  Post- BD FEV1/FVC DLCO (% Pred) VC (% Pred) TLC (% Pred) RV (%Pred)   January 2025 2.72 (91%) 3.20 (88%) 2.16 (91%) 77 (68) 73% (-2.00)        CT LCS (07/2022): Mild centrilobular emphysema. Scattered right upper lobe granulomas. 44 right upper lobe nodule.     Echocardiogram (06/2023): LV normal size and wall thickness with normal systolic function. RV normal in size with normal systolic function.     All imaging and labs were reviewed personally.    ASSESSMENT AND PLAN:     Kristy Garcia is a 58 year old lady with a PMH including fibromyalgia, hyperthyroidism, MDD, HIV on ART, and COPD who presents today for follow up of her COPD and tobacco use.     Although her PFTs today technically do not meet criteria for obstruction by ATS/ERS definitions, she does have some concavity of her expiratory loop that is suggestive of obstruction and she does have mild emphysema on her CT, which supports treating her as such. She is currently on triple therapy though I do wonder, especially given her mild airflow limitation, if she needs the ICS component. However, it is difficult to tell as she is not currently using her inhalers as prescribed. I'd like to get her onto confirmed triple therapy by consolidating her inhalers to once-daily Trelegy and if she has stable to improved symptoms of dyspnea and cough at her next visit, I'd like to step her down to a LAMA/LABA to avoid the side effects of an ICS. I will ask our HIV-COPD care partner to reach out to her to address any barriers to inhaler adherence in 2 weeks.     For her sinus congestion, I recommended that she start daily sinus irrigation. If this does not help, I would have a low threshold to treat with antibiotics and/or refer to ENT.     I congratulated her on continuing to reduce her smoking as she has cut down from 7-8 cpd to 2-5 cpd. She will continue on the Chantix and we will add low dose nicotine patch and gum to help her continue to cut down with the goal of stopping completely.     She is presently engaged in lung cancer screening and will be due again next month. Her primary ID provider has ordered the imaging and she plans to call and get scheduled.     She is up to date on her influenza, pneumonia, and TdaP vaccines.     I will have her follow up in 1 month by telephone to re-evaluate her tobacco treatment and 3 months in person for her COPD.     I personally spent 40 minutes face-to-face and non-face-to-face in the care of this patient, which includes all pre, intra, and post visit time on the date of service.  All documented time was specific to the E/M visit and does not include any procedures that may have been performed.
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
## 2023-12-19 NOTE — Unmapped (Signed)
COVID Education:  Make sure you perform good hand washing (lasting 20 seconds), continue to social distance and limit close personal contact (which may include new sexual partners or having multiple partners during this period).  Try to isolate at home but please find ways to keep in touch with those close to you, such as meeting up with them electronically or socially distanced, and the ability to go outdoors alone or separated from others  If you become ill with fever, respiratory illness, sudden loss of taste and smell, stomach issues, diarrhea, nausea, vomiting - contact clinic for further instructions.  You should go to the emergency department if you develop systems such as shortness of breath, confusion, lightheadedness when standing, high fever.   Here is some information about HIV and CoVid vaccines: [email or web address]  If you're interested in receiving the CoVid vaccine when you're eligible, here are some resources for you to check and make an appointment:  Your local health department   [email or web address] through [facility]  [email or web address]  [facility] (if you are an established patient with them)  [email or web address]    URGENT CARE  Please call ahead to speak with the nursing staff if you are in need of an urgent appointment.       MEDICATIONS  For refills please contact your pharmacy and ask them to electronically send or fax the request to the clinic.   Please bring all medications in original bottles to every appointment.    [facility] (formerly [facility]) or [employer] Eligibility (required even if you do not receive medication through [facility])  Please remember to renew your Juanell Fairly eligibility during renewal periods which occur twice a year: January-March and July-September.     The following are needed for each renewal:   - [facility] Identification (if you don't have one, then a bill with your name and address in [place])   - proof of income (award letter, W-2, or last three check stubs)   If you are unable to come in for renewal, let us know if we can mail, fax or e-mail paperwork to you.   [facility]MAP Contact: [phone].     Lab info:  Your most recent CD4 T-cell counts and viral loads are below. Here are a few things to keep in mind when looking at your numbers:  Our goal is to get your virus to be undetectable and keep it undetectable. If the virus is undetectable you are much more likely to stay healthy.  We consider your viral load to be undetectable if it says <40 or if it says Not detected.  For most people, we're checking CD4 counts every other visit (once or twice a year, or sometimes even less).  It's normal for your CD4 count to be different from visit to visit.   You can help by taking your medications at about the same time, every single day. If you're having trouble with taking your medications, it's important to let us know.    Lab Results   Component Value Date    ACD4 1,378 06/03/2023    CD4 53 06/03/2023    HIVCP <20 07/22/2023    HIVRS Not Detected 06/03/2023        Please note that your laboratory and other results may be visible to you in real time, possibly before they reach your provider. Please allow 48 hours for clinical interpretation of these results. Importantly, even if a result is flagged as abnormal, it may not be one that impacts your  health.    It was nice to have a visit with you today!  Follow-up information:        Provider today:  Varney Daily, FNP-BC      ID CLINIC address:   [facility] at [facility]  [address]  [place], [place] [address]    Contact information:    The ID clinic phone number is [phone]   The ID clinic fax number is [phone]  For urgent issues on nights and weekends: Call the ID Physician on-call through the [facility] Operator at [phone].    Please sign up for My [facility] Chart - This is a great way to review your labs and track your appointments    Please try to arrive 30 minutes BEFORE your scheduled appointment time!  This will give you time to fill out any front desk paperwork needed for your visit, and allow you to be seen as close to your scheduled appointment time as possible.

## 2023-12-19 NOTE — Unmapped (Addendum)
Things that we talked about today:    We are going to replace your current inhalers with a new one that combines the Symbicort and the Spiriva. It is called Trelegy and you should use one puff every day. You can continue to use the albuterol as needed for shortness of breath, wheezing and cough.   We are going to add a nicotine patch and gum to help continue to cut down on cigarette use. I've included instructions below for this.  For your sinus congestion, I'd like to add daily sinus irrigation. The instructions are below.  You will need to call the [facility] at [phone] to have them ship out your new medications.     Nicotine Patches:     Brand Name: Nicoderm     Dose: 21, 14, 7 mg Patches      We can increase or decrease your dose depending on symptoms.      How to use:      Set a date when you intend to stop smoking (quit date).     Begin using the patch on your quit date.     If you miss a dose, use it as soon as you can.     Peel the back off the patch and put it on clean, dry, hair-free skin on the upper arm, chest or back.     Press patch firmly in place for 10 seconds so it will stick well to your skin.     You can bathe, shower or swim while wearing the patch.     You can put tape over the patch if needed.     Avoid wearing the patch on the same area more than once a week.     To dispose of patch, fold the old patch in half with the sticky sides together and throw it in the regular trash away from children or pets.     Remove the patch before a magnetic resonance imaging (MRI) procedure.     If you slip up and smoke, continue using the patch and try not to smoke.     Side effects: skin reaction (redness and itching), sleep issues (vivid dreams or insomnia), mood changes, fast heartbeat, and dizziness.     If you experience sleep issues, please remove the patch during sleeping hours. You can reapply the patch when you awaken or use another form of NRT, especially if you feel cravings in the morning. If you experience hives, swelling of throat or other signs of allergic reaction, please discontinue therapy and report to the emergency room.      Please call the clinic if you have any concerns after starting therapy.      Nicotine Patches:     Brand Name: Nicoderm     Dose: 21, 14, 7 mg Patches      We can increase or decrease your dose depending on symptoms.      How to use:      Set a date when you intend to stop smoking (quit date).     Begin using the patch on your quit date.     If you miss a dose, use it as soon as you can.     Peel the back off the patch and put it on clean, dry, hair-free skin on the upper arm, chest or back.     Press patch firmly in place for 10 seconds so it will stick well to your skin.  You can bathe, shower or swim while wearing the patch.     You can put tape over the patch if needed.     Avoid wearing the patch on the same area more than once a week.     To dispose of patch, fold the old patch in half with the sticky sides together and throw it in the regular trash away from children or pets.     Remove the patch before a magnetic resonance imaging (MRI) procedure.     If you slip up and smoke, continue using the patch and try not to smoke.     Side effects: skin reaction (redness and itching), sleep issues (vivid dreams or insomnia), mood changes, fast heartbeat, and dizziness.     If you experience sleep issues, please remove the patch during sleeping hours. You can reapply the patch when you awaken or use another form of NRT, especially if you feel cravings in the morning.       If you experience hives, swelling of throat or other signs of allergic reaction, please discontinue therapy and report to the emergency room.      Please call the clinic if you have any concerns after starting therapy.      Sinus Rinse Instructions    You can buy the sinus rinse over the counter at any pharmacy - they typically come either in the form of a pot or a sinus squeeze bottle, which I find easier to use.    Wash your hands carefully.  Fill the bottle to the fill line with either water that has been boiled and cooled to room temperature or bottled water.   Add one sinus rinse packet to the bottle and shaked to dissolve the mixture.  While standing in front of the sink, bend forward and tilt your head down.   Keeping your mouth open and continuing to breathe normally, place the cap against one nasal passage. Squeeze the bottle gently until the solution starts draining from your other nasal passage (some of it may drain from your mouth too).  Keep squeezing until about 1/2 of the bottle is used.   Blow your nose gently.  Repeat the same process on the other nasal passage.     It may feel really strange for the first couple of times that you use it, but if you can get used it, it will help your nasal and sinus congestion. Keep using at least once a day.         Please take your medications as prescribed.  Thank you for allowing me to be a part of your care.  Please call the clinic with any questions.    *Being smoke-free makes a difference!  - Increased energy  - Decreased chance of cancer and heart disease  - Better healing from illness and injury  - Saves money  - Saves time    *By using a combination of medication and counseling, we can help you quit*  - Medications help you manage withdrawal symptoms while you quit  - Medications can help decrease urges and cravings to smoke    *It might take many attempts to quit, but every time you try gets you closer to quitting!*    Rosemarie Beath, MD  Pulmonary and Critical Care Medicine  [address]  [id number]  [place], [place] [address]    Thank you for your visit to the [facility].  You may receive a survey from [facility]  regarding your visit today, and we are eager to use this feedback to improve your experience.  Thank you for taking the time to fill it out.    Between appointments, you can reach [place] at these numbers:    For appointments or the ID Nurse: [phone]  Fax: [phone]    For urgent issues after hours:  Hospital Operator: [phone], ask for Pulmonary Fellow on call

## 2023-12-20 ENCOUNTER — Ambulatory Visit: Admit: 2023-12-20 | Discharge: 2023-12-21 | Payer: PRIVATE HEALTH INSURANCE

## 2023-12-20 MED ORDER — RIZATRIPTAN 10 MG TABLET
ORAL_TABLET | Freq: Once | ORAL | 2 refills | 0.00 days | Status: CP | PRN
Start: 2023-12-20 — End: 2024-12-20

## 2023-12-20 NOTE — Unmapped (Signed)
Copied from CRM #[id number]. Topic: Access To Clinicians - Medication Refill  >> Dec 20, 2023  4:25 PM Bria J wrote:  The PAC has received an incoming call requesting medication refill/clarification/question:    Caller: Emoree Dhalia Zingaro  Best callback number: [phone]    Type of request (refill, clarification, question): medication approval  Name of medication: rizatriptan (MAXALT) 10 MG tablet  Is there a preferred amount requested (e.g. 30 pills, 3 months worth - if no leave blank):   Desired pharmacy:   [id number], [place] - [address] [address] AT [facility] OF [address]  [address]  [place] [address]  Phone: [phone] Fax: [phone]    Does caller request a callback from a nurse to discuss?: pt need's medication approved due to insurance

## 2023-12-23 NOTE — Unmapped (Signed)
 I reviewed with the resident the medical history and the resident???s findings on physical examination.  I discussed with the resident the patient???s diagnosis and concur with the treatment plan as documented in the resident note. Trina Ao, MD

## 2023-12-28 ENCOUNTER — Ambulatory Visit: Admit: 2023-12-28 | Discharge: 2023-12-29 | Payer: PRIVATE HEALTH INSURANCE

## 2023-12-28 DIAGNOSIS — F331 Major depressive disorder, recurrent, moderate: Principal | ICD-10-CM

## 2023-12-28 DIAGNOSIS — E0590 Thyrotoxicosis, unspecified without thyrotoxic crisis or storm: Principal | ICD-10-CM

## 2023-12-28 DIAGNOSIS — G4489 Other headache syndrome: Principal | ICD-10-CM

## 2023-12-28 DIAGNOSIS — I10 Essential (primary) hypertension: Principal | ICD-10-CM

## 2023-12-28 DIAGNOSIS — E0500 Thyrotoxicosis with diffuse goiter without thyrotoxic crisis or storm: Principal | ICD-10-CM

## 2023-12-28 DIAGNOSIS — B20 Human immunodeficiency virus [HIV] disease: Principal | ICD-10-CM

## 2023-12-28 DIAGNOSIS — F5101 Primary insomnia: Principal | ICD-10-CM

## 2023-12-28 DIAGNOSIS — R519 Right sided temporal headache: Principal | ICD-10-CM

## 2023-12-28 DIAGNOSIS — R7303 Prediabetes: Principal | ICD-10-CM

## 2023-12-28 LAB — HEMOGLOBIN A1C
ESTIMATED AVERAGE GLUCOSE: 131 mg/dL
HEMOGLOBIN A1C: 6.2 % — ABNORMAL HIGH (ref 4.8–5.6)

## 2023-12-28 LAB — TSH: THYROID STIMULATING HORMONE: 0.497 u[IU]/mL — ABNORMAL LOW (ref 0.550–4.780)

## 2023-12-28 MED ORDER — TRAZODONE 50 MG TABLET
ORAL_TABLET | Freq: Every evening | ORAL | 3 refills | 90.00 days | Status: CP
Start: 2023-12-28 — End: ?
  Filled 2024-02-03: qty 90, 90d supply, fill #0

## 2023-12-28 MED ORDER — METHYLPREDNISOLONE 4 MG TABLETS IN A DOSE PACK
ORAL_TABLET | 0 refills | 6.00 days | Status: CP
Start: 2023-12-28 — End: 2024-01-03
  Filled 2023-12-28: qty 21, 6d supply, fill #0

## 2023-12-28 NOTE — Unmapped (Addendum)
Internal Medicine Clinic Visit    Reason for visit: follow up    A/P:    Other headache syndrome  -     MRI Brain W Wo Contrast; Future  -     methylPREDNISolone; Follow package directions    Graves' ophthalmopathy  -     methylPREDNISolone; Follow package directions  -     Ambulatory Referral to Endocrinology; Future    Right sided temporal headache  One month of right sided temporal headache with neg head Ct, neg inflamm markers. Diff Dx: Graves ophthalmopathy, migraine, ocular headache (glaucoma)  Plan: Diagnostic; MRI, refer back to endo to assess Graves  Therapeutic: medrol dose pack  Will call her again in 2 days    Hyperthyroidism  Overview:  Followed by endocrinology. Taking 5 mg every day of methimazole   Lab Results   Component Value Date    TSH 0.497 (L) 12/28/2023     Plan: Refer back to endocrinology  Increase back to 10 mg of methimazole    Orders:  -     TSH    Prediabetes  -     Hemoglobin A1c    Essential hypertension (RAF-HCC)  Overview:  Pt on amlodipine 10 mg, olmesartan 20 mg   BP: 122/63  controlled  Lab Results   Component Value Date    CREATININE 0.79 12/09/2023     Plan cont meds        Moderate episode of recurrent major depressive disorder (CMS-HCC)  Overview:  Patient currently on trazodone 50 mg, abilify 2 mg, seroquel 50mg   and duloxetine 60 mg , followed by outside psychiatrist. Last saw psychiatrist today but did not mention her SI today.   Has a peer supporter with her today.       12/28/2023     1:15 PM   PHQ-9   PHQ-9 TOTAL SCORE 19     With some suicidality today:   Patient assessed for self-harm or harm to others:      Patient  reports passive thoughts of being better off dead    Endorses suicidal ideation    Denies plan  If endorses: NA    Denies intent  If endorses: NA    Mitigating factor:  son    Guns in the home: no.     If yes, action taken:  non    Hospitalization recommended: no     Other actions taken:  contract with pt to go to ED if she feels she will go to ED    Denies homicidal ideation. If endorses action taken: NA      SAFETY ASSESSMENT:   A suicide and violence risk assessment was performed as part of this evaluation. There patient is deemed to be at chronic elevated risk for self-harm/suicide given the following factors: divorced. The patient is deemed to be at chronic elevated risk for violence given the following factors: lack of insight and history of suicide attempts. These risk factors are mitigated by the following factors:lack of active SI/HI, no know access to weapons or firearms, safe housing, support system in agreement with treatment recommendations, and has peer supporter. There is no acute risk for suicide or violence at this time. The patient was educated about relevant modifiable risk factors including following recommendations for treatment of psychiatric illness and abstaining from substance abuse.   While future psychiatric events cannot be accurately predicted, the patient does not currently require acute inpatient psychiatric care and does not currently meet [facility]  involuntary commitment criteria.   Orders:  -     Ambulatory Referral to Internal Medicine; Future-[facility]    Human immunodeficiency virus (HIV) disease (CMS-HCC)  Overview:  2013-08 HIV dx by Lucienne Minks w/ multispot=indeterminate; HIV RNA = 235k  Currently on Biktarvy.  Followed by [facility]  Lab Results   Component Value Date    ACD4 1,378 06/03/2023    CD4 53 06/03/2023    HIVCP <20 07/22/2023    HIVRS Not Detected 06/03/2023     Plan: Continue current treatment.       Primary insomnia  -Refill:      traZODone; Take 1 tablet (50 mg total) by mouth nightly.         Health Maintenance:   Health Maintenance   Topic Date Due    Mammogram  09/17/2021    COVID-19 Vaccine (6 - 2024-25 season) 07/24/2023    Lung Cancer Screening Shared Decision Making  12/24/2023    Pap Smear with Cotest HPV (21-65)  03/31/2024    Serum Creatinine Monitoring  12/08/2024    Potassium Monitoring  12/08/2024 DTaP/Tdap/Td Vaccines (2 - Td or Tdap) 05/03/2026    HPV Cotest with Pap Smear (21-65)  08/18/2026    Colon Cancer Screening  12/31/2026    COPD Spirometry  12/18/2028    Pneumococcal Vaccine 50+  Completed    Hepatitis C Screen  Completed    Influenza Vaccine  Completed    Zoster Vaccines  Completed         Return in about 3 months (around 03/26/2024).  Next Visit:   Assess headache  __________________________________________________________    HPI:  Pt has chronic pain, HIV, HTN, depression and tobacco use   Right eye pain, and decreased vision, headaches. Has been having them for a month. Taking celebrex, lyrica. Muscle relaxant   Did not try triptan-cost $50.   Eye feels like it's about to explode.   Staying with her son.   Mood:   Pain is hard for her.   Lying around a lot. Saw psychiatrist today. Is going to start therapy.   Goes to [employer].   Often feels this way.   -We contracted that she will go to ED if she feels like she would hurt herself.   Has 2 peer supporters.       Lab Results   Component Value Date    A1C 6.2 (H) 12/28/2023    A1C 6.0 (H) 08/18/2023    A1C 5.2 10/30/2020    A1C 5.3 08/15/2020    A1C 5.5 01/23/2018        Lab Results   Component Value Date    TSH 0.497 (L) 12/28/2023         PTHomeBP   Lab Results   Component Value Date    ACD4 1,378 06/03/2023    CD4 53 06/03/2023    HIVCP <20 07/22/2023    HIVRS Not Detected 06/03/2023       __________________________________________________________    Problem List:  Patient Active Problem List   Diagnosis    Human immunodeficiency virus (HIV) disease (CMS-HCC)    Hyperthyroidism    Suicide and self-inflicted injury (CMS-HCC)    Tobacco use disorder    Cocaine use disorder, mild, in sustained remission, abuse (CMS-HCC)    Alcohol use disorder, moderate, in sustained remission (CMS-HCC)    HIV (human immunodeficiency virus infection) (CMS-HCC)    Essential hypertension (RAF-HCC)    Hyperlipidemia    Osteopenia  Sciatica of right side Cervical dysplasia    Joint pain in both hands    Chronic pain of left ankle    Arthralgia of multiple sites, bilateral    Moderate episode of recurrent major depressive disorder (CMS-HCC)    Colon cancer screening    Chronic midline low back pain with bilateral sciatica    Primary osteoarthritis of both hands    Chronic pain syndrome    Encounter for long-term (current) use of high-risk medication    Myalgia, multiple sites    Centrilobular emphysema (CMS-HCC)    Blurred vision    Right sided temporal headache       Medications:  Reviewed in EPIC    _________________________________________________________    Physical Exam:   Vital Signs:  Vitals:    12/28/23 1306   BP: 122/63   BP Site: L Arm   Pulse: 67   Resp: 16   Temp: 36.6 ??C (97.8 ??F)   TempSrc: Oral   SpO2: 97%   Weight: 74.8 kg (164 lb 12.8 oz)   Height: 162.6 cm (5' 4.02)     Wt Readings from Last 3 Encounters:   12/28/23 74.8 kg (164 lb 12.8 oz)   12/19/23 73.8 kg (162 lb 11.2 oz)   12/19/23 73.8 kg (162 lb 9.6 oz)     Gen: Well appearing, NAD; well kempt  HEENT: slightly disconjugate gaze (this is old per pt)   CV: RRR, no murmurs  Pulm: CTA bilaterally, no crackles or wheezes  Abd: Soft, NTND, normal BS. No HSM.  Ext: No edema  Psych:normal affect; no tangential thoughts. Speech pattern normal    Records review  Last   Lab Results   Component Value Date    CREATININE 0.79 12/09/2023    CHOL 138 08/18/2023    HDL 40 08/18/2023    LDL 66 08/18/2023    NONHDL 98 08/18/2023    TRIG 161 (H) 08/18/2023    A1C 6.2 (H) 12/28/2023        The 10-year ASCVD risk score (Arnett DK, et al., 2019) is: 9.1%   Medication adherence and barriers to the treatment plan have been addressed. Opportunities to optimize healthy behaviors have been discussed. Patient / caregiver voiced understanding.         I personally spent 48 minutes face-to-face and non-face-to-face in the care of this patient, which includes all pre, intra, and post visit time on the date of service.

## 2023-12-28 NOTE — Unmapped (Addendum)
Kristy Garcia, it was good to see you today.   This is what we discussed:   Let me know how your headache is

## 2023-12-29 MED ORDER — PROPYLTHIOURACIL 50 MG TABLET
ORAL_TABLET | Freq: Two times a day (BID) | ORAL | 5 refills | 30.00 days | Status: CP
Start: 2023-12-29 — End: 2024-06-26
  Filled 2024-01-02: qty 100, 50d supply, fill #0

## 2023-12-29 NOTE — Unmapped (Signed)
[facility] Internal Medicine   Garcia MANAGEMENT ENCOUNTER           Date of Service:  12/29/2023      Service:  Garcia Coordination - phone  MyChart use by patient is active: yes    Post-outreach Action Items:  Provider: No/none.  CM:  follow up with patient via MyChart with potential in-network therapists.  Patient:  contact Kristy Garcia to request sooner appointment with psychiatrist.Ask if any therapists at [facility] have availability to see you for talk therapy.    Purpose of contact:     Garcia Manager (CM) received request from PCP to contact patient to check in and reassess mood.  PCP informed CM Patient presented with passive SI at PCP appointment yesterday. PCP shared that patient has a psychiatrist, however is not scheduled to see them for 2 months.    Garcia Manager (CM) completed the following related to the above request:  Reviewed chart  At 12/28/23 PCP Visit, PHQ9: 19, positive for SI  Endorsed SI. Denied plan, denied intent.  P4 - Acute Suicide Risk: Lower  Mitigating factor: son  Contracted with PCP that she will go to the ED if she feels like she would hurt herself.  Supports: lives with son, 2 Peer Support Persons, Training and development officer, will be starting therapy soon    Identified resources  See MH Crisis contacts below  [employer] Garcia: [phone]    Attempted call to [employer]. [place].  Left generic voicemail requesting callback.    Later completed call with Patient. Discussed the following:   Mood:  Patient states she is feeling good today, feeling better.   Headache is not as bad. Reports she has not had a bad headache since last week.  Had to drop out of school due to severe headaches and eyes hurting so much.  States mood is okay. not my best, but much better than the last four weeks  Denies SI today. Denies plan, denies intent.      Mental Health Services:  Pt states she sees psychiatrist for med management at [facility] in [place]. CM encouraged patient to contact [facility] to schedule sooner appointment with psychiatrist and to ask about availability of therapist.  Pt confirmed interest in getting connected to talk therapy.  Plans to ask [facility], though knows they have limited therapists.  Patient requesting assistance identifying local therapist for in-person talk therapy in [facility] ideally. Open to [facility] area.  CM to check on potential options in [place] and surrounding areas and follow up with patient via MyChart.    Reports she has been connected to two Peer Support Specialists through the [facility] for 2-3 years. Has found peer support to be helpful.   Also in TCL (Transitions to [employer]) program trying to get housing.   States almost all of her paperwork has been approved and she is in the process of identifying housing through the [employer].     Discussed if mood worsens and Patient feels she like she may hurt herself, Patient will present to nearest ED.       Additional information:  Did an emergency withdrawal from at [employer] d/t the headaches. Plans to return to school part-time in March.   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  Is considering returning to previous school, [employer], because she liked the communication and support among  professors, students, and staff.    Patient saw via MyChart that her TSH lab taken yesterday was abnormal(low)  Pt will be going back to see Endocrinologist. Scheduled 04/25/24, but on a notification list for if an appointment opens up sooner.    A copy of this Patient Outreach Encounter was sent to patient's Primary Garcia Provider.          Mental Health Crisis Resources:    Suicide & Crisis Hotlines    General Crisis Lines   988 Suicide and Crisis Lifeline   Phone: 28   Website: HandlingCost.fr    For crisis counseling, call or text 988 or chat online.   For Spanish-speaking crisis counselors, call 988 and press option 2, text ???AYUDA??? to 988, or chat online at MedicationWarning.is    For deaf and hard of hearing, use your preferred relay service or dial 711 then 988; 988 Videophone for Deaf/Hard of Hearing American Sign Language users: https://988.aslnow.io/phones/100030001/       Crisis Text Line   Text or chat to connect with a Environmental health practitioner providing 24/7 support.   Text: Text HOME to ([phone])058-5678   Hours: 24/7   Website: http://cook.com/      Statewide Peer Warmline   A phone line staffed by Peer Support Specialists who offer non-clinical support and resources to those in crisis and available to all [employer].    Phone: 1-855-PEERS [employer] 774 623 8919)   Hours: 24/7   Website: BasicBling.tn      The Hopeline    Phone: (210)753-5617 (call) or [phone] (text and call)   Hours: The Crisis Line is available 24/7; the Text Line is open Monday-Friday 3 pm - 9 pm   Website: https://www.hopeline-Clarence.org/      [employer] Crisis Line     Call BlackLine   Call or text line for crisis support for [facility].    Phone: ([phone] (call or text)   Website: https://www.callblackline.com/          For patients with no insurance or Direct Medicaid see below for your county's [facility]). Call your MCO???s crisis line for crisis services.           [facility]    [address], [address]    [place], [place] [address]    Phone:?[phone]    Fax:?[phone]    Crisis Line:?[phone]    Counties Served: Film/video editor, Lyn Hollingshead, Scientist, physiological, [place], [place], [place], [place], [place], [place], [place], [place], [place], [place], [place], [place], [place], [place], [place], [place], [place], [place], [place], [place], [place], [place], [place], [place], [place], [place], [place], Sherlon Handing  and [place] [facility] Urgent Cares & Mobile Crisis       Behavioral Health Urgent Cares in [facility]   [address] [place], [place] [address] (Entrance at back)   Phone: [phone]   Fax: [phone]   Hours: Monday-Thursday, 8am-7pm; Friday, 8am-3pm   Website: DumpJokes.com.cy      [facility]   [address], [place], [place] [address]   Phone: [phone]   24 Hour Crisis Hotline: 306-401-3532     Fax: [phone]   Hours: 24/7   Website: [email or web address]://[email or web address]/locations/[email or web address]      [facility]   [address], [place], [place] [address]   Phone: [phone]   Hours: 24/7   Website: MadeCash.com.br       [facility] [place]  [facility]   [address], [place], [place] [address]   Phone: [phone]   Fax: [phone]   Website: https://www.[email or web address]/locations/bhuc-[email or web address]      [facility]gent Garcia   [address] [place], [place], [place] [address]   Phone: [phone]   24 Hour Crisis Hotline: [phone]     Fax: [phone]   Hours: 24/7   Website: https://www.[email or web address]/locations/bhuc-[email or web address]      [facility]   [address] [place], [place] [address]   Phone: [phone]   24 Hour Crisis Hotline: (769)673-7505       Fax: [phone]   Hours: 24/7   Website: [email or web address]://www.[email or web address]/locations/[email or web address]      [facility] Urgent Garcia   [address], [place], [place] [address]   Phone: [phone]   Hours: Monday-Thursday, 8am-10pm; Friday, 8am-8pm; Saturday and Sunday, 8am-5pm   Website: http://www.[email or web address]/           [employer] Crisis:   [facility] Mobile Crisis Management Central   Phone: [phone]   Hours: 24/7   Website: [email or web address]://rhahealthservices.org/mobile-crisis-management/

## 2023-12-29 NOTE — Unmapped (Signed)
I called pt:   -headache is better on medrol dose pack  -she stopped methimazole because it made her sweat  -her mood is better  Plan:   Stop methimazole  Start PTU 50 mg bid. Check TSH at follow up  Patient verbalized understanding.

## 2023-12-30 DIAGNOSIS — J432 Centrilobular emphysema: Principal | ICD-10-CM

## 2023-12-30 NOTE — Unmapped (Signed)
 I was the supervising physician in the delivery of the service. Jacquiline Doe, MD

## 2024-01-06 NOTE — Unmapped (Signed)
 [facility] Specialty and Home Delivery Pharmacy Refill Coordination Note    Specialty Medication(s) to be Shipped:   Infectious Disease: Biktarvy    Other medication(s) to be shipped:  omeprazole, Chantix, lidocaine patches     Kristy Garcia, DOB: Jul 25, 1966  Phone: There are no phone Garcia on file.      All above HIPAA information was verified with patient.     Was a Nurse, learning disability used for this call? No    Completed refill call assessment today to schedule patient's medication shipment from the [facility] and Home Delivery Pharmacy  [phone]).  All relevant notes have been reviewed.     Specialty medication(s) and dose(s) confirmed: Regimen is correct and unchanged.   Changes to medications: Kanani reports no changes at this time.  Changes to insurance: No  New side effects reported not previously addressed with a pharmacist or physician: None reported  Questions for the pharmacist: No    Confirmed patient received a Conservation officer, historic buildings and a Surveyor, mining with first shipment. The patient will receive a drug information handout for each medication shipped and additional FDA Medication Guides as required.       DISEASE/MEDICATION-SPECIFIC INFORMATION        N/A    SPECIALTY MEDICATION ADHERENCE     Medication Adherence    Patient reported X missed doses in the last month: 0  Specialty Medication: BIKTARVY 50-200-25 mg tablet (bictegrav-emtricit-tenofov ala)  Patient is on additional specialty medications: No  Patient is on more than two specialty medications: No  Any gaps in refill history greater than 2 weeks in the last 3 months: no  Demonstrates understanding of importance of adherence: yes  Informant: patient  Confirmed plan for next specialty medication refill: delivery by pharmacy  Refills needed for supportive medications: not needed          Refill Coordination    Has the Patients' Contact Information Changed: No  Is the Shipping Address Different: No         Were doses missed due to medication being on hold? No    BIKTARVY 50-200-25   mg: 5 days of medicine on hand       REFERRAL TO PHARMACIST     Referral to the pharmacist: Not needed      [facility]     Shipping address confirmed in Epic.       Delivery Scheduled: Yes, Expected medication delivery date: 01/10/24.     Medication will be delivered via Next Day Courier to the prescription address in Epic [place].    Kristy Garcia   [facility] Special[facility] and Home Delivery Pharmacy  Specialty Technician

## 2024-01-07 ENCOUNTER — Inpatient Hospital Stay: Admit: 2024-01-07 | Discharge: 2024-01-08 | Payer: PRIVATE HEALTH INSURANCE

## 2024-01-09 MED FILL — LIDOCAINE 5 % TOPICAL PATCH: TRANSDERMAL | 30 days supply | Qty: 30 | Fill #1

## 2024-01-09 MED FILL — VARENICLINE TARTRATE 1 MG TABLET: ORAL | 28 days supply | Qty: 56 | Fill #2

## 2024-01-09 MED FILL — OMEPRAZOLE 20 MG CAPSULE,DELAYED RELEASE: ORAL | 90 days supply | Qty: 90 | Fill #0

## 2024-01-10 MED ORDER — STIOLTO RESPIMAT 2.5 MCG-2.5 MCG/ACTUATION SOLUTION FOR INHALATION
Freq: Every day | RESPIRATORY_TRACT | 3 refills | 14.00 days | Status: CP
Start: 2024-01-10 — End: ?

## 2024-01-16 ENCOUNTER — Ambulatory Visit: Admit: 2024-01-16 | Payer: PRIVATE HEALTH INSURANCE

## 2024-01-16 NOTE — Unmapped (Unsigned)
 Hasn't gotten NRT yet

## 2024-01-20 NOTE — Unmapped (Signed)
 HIV-COPD Telehealth Program      Spoke with Kristy Garcia on 01/20/24 to follow up on COPD symptoms.    MMRC Score: Which of these statements is the closest to how you feel when walking on level ground?  1: I get short of breath when hurrying on the level or walking up a slight hill      Current Inhaler Regimen:   Trelegy used daily. Pt states it really helping a lot. Albuterol used PRN    Barriers to adherence: None        Advice given to patient:Reinforced correct dosing None needed at this time.      Changes made to inhaler regimen:      Time duration of contact:  10 minutes talking with pt    Interval to next planned contact (ASAP, 2 weeks, 4 weeks, 6-8 weeks):      Provider for next planned contact (RN/CMA or MD):      Medication education provided? No.      Additional notes:

## 2024-01-23 MED ORDER — ALBUTEROL SULFATE HFA 90 MCG/ACTUATION AEROSOL INHALER
RESPIRATORY_TRACT | 11 refills | 17.00 days | Status: CP | PRN
Start: 2024-01-23 — End: 2025-01-22
  Filled 2024-02-03: qty 18, 17d supply, fill #0

## 2024-02-02 DIAGNOSIS — R059 Cough, unspecified type: Principal | ICD-10-CM

## 2024-02-02 MED ORDER — BUDESONIDE-FORMOTEROL HFA 80 MCG-4.5 MCG/ACTUATION AEROSOL INHALER
Freq: Two times a day (BID) | RESPIRATORY_TRACT | 5 refills | 21.00 days | Status: CP
Start: 2024-02-02 — End: 2025-02-01

## 2024-02-02 NOTE — Unmapped (Signed)
 [facility] Specialty and Home Delivery Pharmacy Refill Coordination Note    Specialty Medication(s) to be Shipped:   Infectious Disease: Biktarvy    Other medication(s) to be shipped:  trazodone, symbicort,ventolin     Kristy Garcia, DOB: [birth year]  Phone: There are no phone numbers on file.      All above HIPAA information was verified with patient.     Was a Nurse, learning disability used for this call? No    Completed refill call assessment today to schedule patient's medication shipment from the [facility] and Home Delivery Pharmacy  [phone]).  All relevant notes have been reviewed.     Specialty medication(s) and dose(s) confirmed: Regimen is correct and unchanged.   Changes to medications: [name] reports no changes at this time.  Changes to insurance: No  New side effects reported not previously addressed with a pharmacist or physician: None reported  Questions for the pharmacist: No    Confirmed patient received a Conservation officer, historic buildings and a Surveyor, mining with first shipment. The patient will receive a drug information handout for each medication shipped and additional FDA Medication Guides as required.       DISEASE/MEDICATION-SPECIFIC INFORMATION        N/A    SPECIALTY MEDICATION ADHERENCE     Medication Adherence    Patient reported X missed doses in the last month: 0  Specialty Medication: BIKTARVY 50-200-25 mg tablet (bictegrav-emtricit-tenofov ala)  Patient is on additional specialty medications: No  Patient is on more than two specialty medications: No  Any gaps in refill history greater than 2 weeks in the last 3 months: no  Demonstrates understanding of importance of adherence: yes  Informant: patient  Reliability of informant: reliable  Provider-estimated medication adherence level: good  Patient is at risk for Non-Adherence: No  Reasons for non-adherence: no problems identified  Confirmed plan for next specialty medication refill: delivery by pharmacy  Refills needed for supportive medications: not needed          Refill Coordination    Has the Patients' Contact Information Changed: No  Is the Shipping Address Different: No         Were doses missed due to medication being on hold? No    Biktarvy  50/200/25 mg:  days of medicine on hand       REFERRAL TO PHARMACIST     Referral to the pharmacist: Not needed      [facility]     Shipping address confirmed in Epic.     Cost and Payment: Patient has a $0 copay, payment information is not required.    Delivery Scheduled: Yes, Expected medication delivery date: 03/14.     Medication will be delivered via Same Day Courier to the prescription address in Epic WAM.    Antonietta Barcelona   [facility] Specialty and Home Delivery Pharmacy  Specialty Technician

## 2024-02-03 ENCOUNTER — Ambulatory Visit
Admit: 2024-02-03 | Discharge: 2024-02-04 | Payer: PRIVATE HEALTH INSURANCE | Attending: Student in an Organized Health Care Education/Training Program | Primary: Student in an Organized Health Care Education/Training Program

## 2024-02-03 MED FILL — SYMBICORT 80 MCG-4.5 MCG/ACTUATION HFA AEROSOL INHALER: RESPIRATORY_TRACT | 30 days supply | Qty: 10.2 | Fill #0

## 2024-02-03 MED FILL — BIKTARVY 50 MG-200 MG-25 MG TABLET: ORAL | 30 days supply | Qty: 30 | Fill #1

## 2024-02-03 NOTE — Unmapped (Signed)
 Grave's disease  - No gross proptosis, no abnormal scleral show or lid retraction on exam today  - PCP recently 12/2023,  gave a medrol dose pack with concern for TED following very low TSH  - Now on PTU 50mg  bid, states things are better  - I do not have concern for active TED on exam today  - Recommend TSI and repeat thyroid labs on followup with PCP/endocrine  - MRI ordered, but never done, no apparently EOM enlargement on recent CT    Left eye orbital fracture s/p repair 2001  - No diplopia or enophthalmos    Hyperopia with astigmatism and presbyopia  - New MRx given    Dry Eye / MGD  - ATs + WCs      RTC 6 months for reassessment for TED, no dilation    CC: Marguerita Beards

## 2024-02-21 MED ORDER — QUETIAPINE 25 MG TABLET
ORAL_TABLET | 1 refills | 0 days
Start: 2024-02-21 — End: ?

## 2024-02-21 NOTE — Unmapped (Signed)
 [facility] Specialty and Home Delivery Pharmacy Refill Coordination Note    Specialty Medication(s) to be Shipped:   Infectious Disease: Biktarvy    Other medication(s) to be shipped:  Lidocaine patch, Diclofenac gel, Quetiapine, Celecoxib, Duloxetine, Fluticasone, Propythiouracil, Symbicort, Ventolin     Kristy Garcia, DOB: [birth year]  Phone: There are no phone Garcia on file.      All above HIPAA information was verified with patient.     Was a Nurse, learning disability used for this call? No    Completed refill call assessment today to schedule patient's medication shipment from the [facility] and Home Delivery Pharmacy  [phone]).  All relevant notes have been reviewed.     Specialty medication(s) and dose(s) confirmed: Regimen is correct and unchanged.   Changes to medications: Kemonie reports no changes at this time.  Changes to insurance: No  New side effects reported not previously addressed with a pharmacist or physician: None reported  Questions for the pharmacist: No    Confirmed patient received a Conservation officer, historic buildings and a Surveyor, mining with first shipment. The patient will receive a drug information handout for each medication shipped and additional FDA Medication Guides as required.       DISEASE/MEDICATION-SPECIFIC INFORMATION   i     N/A    SPECIALTY MEDICATION ADHERENCE     Medication Adherence    Patient reported X missed doses in the last month: 0  Specialty Medication: bictegrav-emtricit-tenofov ala: BIKTARVY 50-200-25 mg tablet  Patient is on additional specialty medications: No              Were doses missed due to medication being on hold? No    Biktarvy 50-200-25 mg: 13 days of medicine on hand        Referral to the pharmacist: Not needed      SHIPPING     Shipping address confirmed in Epic.     Cost and Payment:  Pt unable to pay copays, medicaid pt    Delivery Scheduled: Yes, Expected medication delivery date: 02/28/24.     Medication will be delivered via Same Day Courier to the prescription address in Epic [place].    Willette Pa   [facility] Specialty and Home Delivery Pharmacy  Specialty Technician

## 2024-02-22 ENCOUNTER — Emergency Department
Admit: 2024-02-22 | Discharge: 2024-02-22 | Disposition: A | Discharge status: Home / Self Care | Attending: Student in an Organized Health Care Education/Training Program

## 2024-02-22 DIAGNOSIS — R052 Subacute cough: Principal | ICD-10-CM

## 2024-02-22 DIAGNOSIS — J22 Unspecified acute lower respiratory infection: Principal | ICD-10-CM

## 2024-02-22 MED ORDER — PREDNISONE 20 MG TABLET
ORAL_TABLET | Freq: Every day | ORAL | 0 refills | 5 days | Status: CP
Start: 2024-02-22 — End: 2024-02-27

## 2024-02-22 MED ORDER — AZITHROMYCIN 250 MG TABLET
ORAL_TABLET | ORAL | 0 refills | 5 days | Status: CP
Start: 2024-02-22 — End: 2024-02-27

## 2024-02-22 MED ORDER — LORATADINE 10 MG TABLET
ORAL_TABLET | Freq: Every day | ORAL | 0 refills | 30 days | Status: CP
Start: 2024-02-22 — End: ?

## 2024-02-22 MED ORDER — AMOXICILLIN 875 MG-POTASSIUM CLAVULANATE 125 MG TABLET
ORAL_TABLET | Freq: Two times a day (BID) | ORAL | 0 refills | 10 days | Status: CP
Start: 2024-02-22 — End: 2024-03-03

## 2024-02-22 NOTE — Unmapped (Signed)
 Pt with URI sx x 1 week. Friend was recently in the hospital for covid.

## 2024-02-22 NOTE — Unmapped (Addendum)
 [facility]  Emergency Department Provider Note      ED Clinical Impression      Final diagnoses:   Subacute cough   Lower respiratory tract infection (Primary)            Impression, Medical Decision Making, Progress Notes and Critical Care      Impression, Differential Diagnosis and Plan of Care    Patient is a 58 y.o. female with PMH of  HTN, HLD, HIV(CD4 54, on Biktarvy ), Graves disease, and depression presenting for evaluation of 2 weeks of a cough with associated chest pain with coughing.     On exam, the patient is alert, oriented, and in no acute distress. VS are within normal limits. Exam is remarkable for serous fluid behind the tympanic membrane, no erythema. Right lower lobe crackles.     Differential includes bronchitis versus pneumonia versus COVID versus flu versus not concerning respiratory infection.    Patient had chest x-ray and COVID/flu/RSV testing done prior to provider assignment rooming.  Patient's chest x-ray shows some mild infiltrates and with her lung exam revealing right lower lobe crackles, bibasilar rhonchus sounds, deep coarse rhonchus, with no upper significant viral signs.  Her COVID flu and RSV testing was negative.  She does have history of HIV and asthma.    ED Course as of 03/03/24 1158   Wed Feb 22, 2024   1759 Will cover patient for possible developing pneumonia with course of azithromycin  over 5 days, 10 days of Augmentin  twice daily, and 5 days of prednisone .  Patient given return precautions and discharged.           Portions of this record have been created using Scientist, clinical (histocompatibility and immunogenetics). Dictation errors have been sought, but may not have been identified and corrected.    See chart and resident provider documentation for details.    ____________________________________________         History        Reason for Visit  URI        HPI   Kristy Garcia is a 58 y.o. female with a PMH of HTN, HLD, HIV(CD4 54, on Biktarvy ), Graves disease, and depression. The patient reports 2 weeks of a worsening cough with associated chest pain when she coughs. The patient reports that her asthma and allergies have been worse due to the pollen which she has been taking Benadryl and other allergy medications with no relief. She states she has had to increase her usage of her albuterol  inhaler due to the pollen. She notes that her eye have been irritated and difficult to open in the morning due to her allergies and she has been using an eye drop to lubricate them. She notes that she has not had her CD4 count taken recently but states she is undetectable. Denies fever, nausea, emesis, or diarrhea.       External Records Reviewed  (Inpatient/Outpatient notes, Prior labs/imaging studies, Care Everywhere, PDMP, External ED notes, etc)    (12/28/2023) Internal Med Office Visit. Reviewed past medical history.     Past Medical History:   Diagnosis Date    Asthma     Bronchitis     Chest pain 01/23/2018    Dental caries     Depression     Depressive disorder 09/27/2013    R/o substance induced mood disorder    Fibromyalgia     HIV (human immunodeficiency virus infection)     HLD (hyperlipidemia)     Hypertension  Hypothyroidism     Palpitations 01/23/2018    Recurrent Candidal vulvovaginitis 03/11/2014    Reflux     Subacute frontal sinusitis 12/19/2023    Sudden onset of severe headache 12/02/2023       Patient Active Problem List   Diagnosis    Human immunodeficiency virus (HIV) disease    Hyperthyroidism    Suicide and self-inflicted injury    Tobacco use disorder    Cocaine use disorder, mild, in sustained remission, abuse (CMS-HCC)    Alcohol use disorder, moderate, in sustained remission    HIV (human immunodeficiency virus infection)    Essential hypertension (RAF-HCC)    Hyperlipidemia    Osteopenia    Sciatica of right side    Cervical dysplasia    Joint pain in both hands    Chronic pain of left ankle    Arthralgia of multiple sites, bilateral    Moderate episode of recurrent major depressive disorder (CMS-HCC)    Colon cancer screening    Chronic midline low back pain with bilateral sciatica    Primary osteoarthritis of both hands    Chronic pain syndrome    Encounter for long-term (current) use of high-risk medication    Myalgia, multiple sites    Centrilobular emphysema    Blurred vision    Right sided temporal headache       Past Surgical History:   Procedure Laterality Date    DILATION AND CURETTAGE OF UTERUS      EYE SURGERY Left     PR COLONOSCOPY FLX DX W/COLLJ SPEC WHEN PFRMD N/A 12/31/2016    Procedure: COLONOSCOPY, FLEXIBLE, PROXIMAL TO SPLENIC FLEXURE; DIAGNOSTIC, W/WO COLLECTION SPECIMEN BY BRUSH OR WASH;  Surgeon: Galvin Jules, MD;  Location: [facility];  Service: Gastroenterology    PR REPAIR ING HERNIA,5+Y/O,STRANG Left 04/17/2019    Procedure: PRIORITY REPAIR INITIAL INGUINAL HERNIA, AGE 18 YEARS OR OLDER; INCARCERATED OR STRANGULATED;  Surgeon: Netta Bar, MD;  Location: [facility];  Service: General Surgery       No current facility-administered medications for this encounter.    Current Outpatient Medications:     acetaminophen  (TYLENOL  8 HOUR) 650 MG CR tablet, Take 2 tablets (1,300 mg total) by mouth in the morning., Disp: , Rfl:     albuterol  HFA 90 mcg/actuation inhaler, Inhale 2 puffs every four (4) hours as needed for wheezing., Disp: 18 g, Rfl: 11    amlodipine  (NORVASC ) 10 MG tablet, Take 1 tablet (10 mg total) by mouth daily., Disp: 90 tablet, Rfl: 3    amoxicillin -clavulanate (AUGMENTIN ) 875-125 mg per tablet, Take 1 tablet by mouth two (2) times a day for 10 days., Disp: 20 tablet, Rfl: 0    aripiprazole (ABILIFY) 2 MG tablet, Take 1 tablet (2 mg total) by mouth every morning., Disp: , Rfl:     bictegrav-emtricit-tenofov ala (BIKTARVY ) 50-200-25 mg tablet, Take 1 tablet by mouth daily., Disp: 30 tablet, Rfl: 11    budesonide -formoterol  (SYMBICORT ) 80-4.5 mcg/actuation inhaler, Inhale 2 puffs two (2) times a day., Disp: 6.9 g, Rfl: 5    celecoxib  (CELEBREX ) 100 MG capsule, Take 2 capsules (200 mg total) by mouth two (2) times a day., Disp: 360 capsule, Rfl: 1    diclofenac  sodium (VOLTAREN ) 1 % gel, Apply 2 grams topically four (4) times a day., Disp: 450 g, Rfl: 5    DULoxetine  (CYMBALTA ) 60 MG capsule, Take 1 capsule (60 mg total) by mouth daily., Disp: 90 capsule, Rfl:  3    DULoxetine (CYMBALTA) 60 MG capsule, Take 1 capsule every day by oral route at bedtime for 90 days., Disp: 90 capsule, Rfl: 1    fluticasone propionate (FLONASE) 50 mcg/actuation nasal spray, Instill 1 spray into each nostril daily., Disp: 16 g, Rfl: 5    lidocaine (LIDODERM) 5 % patch, Apply 1 patch to affected area for 12 hours only each day (then remove patch), Disp: 180 patch, Rfl: 1    loratadine (CLARITIN) 10 mg tablet, Take 1 tablet (10 mg total) by mouth daily., Disp: 30 tablet, Rfl: 0    naloxone (NARCAN) 4 mg nasal spray, One spray in either nostril once for known/suspected opioid overdose. May repeat every 2-3 minutes in alternating nostril til EMS arrives, Disp: 2 each, Rfl: 0    nicotine polacrilex (NICORETTE) 2 mg gum, Apply 1 each (2 mg total) to cheek every hour as needed for smoking cessation., Disp: 110 each, Rfl: 3    olmesartan (BENICAR) 20 MG tablet, Take 1 tablet (20 mg total) by mouth daily., Disp: 90 tablet, Rfl: 3    omeprazole (PRILOSEC) 20 MG capsule, Take 1 capsule (20 mg total) by mouth daily., Disp: 90 capsule, Rfl: 3    pravastatin (PRAVACHOL) 40 MG tablet, Take 1 tablet (40 mg total) by mouth daily., Disp: 90 tablet, Rfl: 3    pregabalin (LYRICA) 150 MG capsule, Take 1 capsule (150 mg total) by mouth two (2) times a day., Disp: 180 capsule, Rfl: 1    propylthiouracil (PTU) 50 mg tablet, Take 1 tablet (50 mg total) by mouth two (2) times a day., Disp: 60 tablet, Rfl: 5    QUEtiapine (SEROQUEL) 25 MG tablet, Take 1 full tablet by mouth nightly., Disp: 90 tablet, Rfl: 1    tizanidine (ZANAFLEX) 2 MG tablet, Take 1 tablet (2 mg total) by mouth every eight (8) hours as needed., Disp: 30 tablet, Rfl: 0    traZODone (DESYREL) 50 MG tablet, Take 1-2 tablets by mouth as needed for sleep., Disp: 180 tablet, Rfl: 1    traZODone (DESYREL) 50 MG tablet, Take 1 tablet (50 mg total) by mouth nightly., Disp: 90 tablet, Rfl: 3    Allergies  Codeine, Ibuprofen, Adhesive tape-silicones, and Adhesive    Family History   Problem Relation Age of Onset    Depression Mother     Cataracts Mother     Macular degeneration Mother     Colon cancer Maternal Uncle     Colon cancer Maternal Uncle     Colon cancer Paternal Aunt     Asthma Paternal Grandmother     Asthma Paternal Grandfather        Social History  Social History     Tobacco Use    Smoking status: Every Day     Current packs/day: 1.00     Average packs/day: 1 pack/day for 39.0 years (39.0 ttl pk-yrs)     Types: Cigarettes    Smokeless tobacco: Former   Haematologist status: Some Days   Substance Use Topics    Alcohol use: Yes     Alcohol/week: 2.0 standard drinks of alcohol     Types: 2 Cans of beer per week     Comment: occ    Drug use: Yes     Types: Marijuana          Physical Exam     ED Triage Vitals [02/22/24 1437]   Enc Vitals Group  BP 151/70      Heart Rate 67      SpO2 Pulse       Resp 17      Temp 36.5 ??C (97.7 ??F)      Temp src       SpO2 96 %      Weight 74.4 kg (164 lb)      Height       Head Circumference       Peak Flow       Pain Score       Pain Loc       Pain Education       Exclude from Growth Chart    , no erythema. Right lower lobe crackles.     Constitutional: Alert and oriented. Well appearing and in no distress.  Eyes: Conjunctivae are normal.  ENT       Head: Normocephalic and atraumatic.       Nose: No congestion.       Mouth/Throat: Mucous membranes are moist.       Neck: No stridor.  Ear: Serous fluid behind the tympanic membranes with no erythema.  Hematological/Lymphatic/Immunilogical: No cervical lymphadenopathy.  Cardiovascular: Normal rate, regular rhythm. Respiratory: Right lower lobe crackles.   Genitourinary: Deferred.   Musculoskeletal: Normal range of motion in all extremities.  Neurologic: Normal speech and language. No gross focal neurologic deficits are appreciated.  Skin: Skin is warm, dry and intact. No rash noted.  Psychiatric: Mood and affect are normal. Speech and behavior are normal.       Radiology     XR Chest 2 views   Final Result      No acute cardiopulmonary abnormalities.              Documentation assistance was provided by Isabell Manzanilla, Scribe on February 22, 2024 at 5:27 PM for Mackey Sayers, [place].    February 22, 2024 5:49 PM. Documentation assistance provided by the scribe. I was present during the time the encounter was recorded. The information recorded by the scribe was done at my direction and has been reviewed and validated by me.            Emogene Harpin, PA  02/22/24 1750       Emogene Harpin, [place]  03/03/24 1158

## 2024-02-24 MED ORDER — QUETIAPINE 25 MG TABLET
ORAL_TABLET | 1 refills | 0 days
Start: 2024-02-24 — End: ?

## 2024-02-27 ENCOUNTER — Ambulatory Visit: Admit: 2024-02-27

## 2024-02-27 MED ORDER — QUETIAPINE 25 MG TABLET
ORAL_TABLET | 1 refills | 0.00 days
Start: 2024-02-27 — End: ?

## 2024-02-28 MED ORDER — FLUCONAZOLE 150 MG TABLET
ORAL_TABLET | Freq: Once | ORAL | 0 refills | 2.00 days | Status: CP
Start: 2024-02-28 — End: 2024-03-01
  Filled 2024-02-28: qty 2, 2d supply, fill #0

## 2024-02-28 MED FILL — FLUTICASONE PROPIONATE 50 MCG/ACTUATION NASAL SPRAY,SUSPENSION: NASAL | 60 days supply | Qty: 16 | Fill #2

## 2024-02-28 MED FILL — DULOXETINE 60 MG CAPSULE,DELAYED RELEASE: ORAL | 90 days supply | Qty: 90 | Fill #2

## 2024-02-28 MED FILL — LIDOCAINE 5 % TOPICAL PATCH: TRANSDERMAL | 30 days supply | Qty: 30 | Fill #2

## 2024-02-28 MED FILL — BIKTARVY 50 MG-200 MG-25 MG TABLET: ORAL | 30 days supply | Qty: 30 | Fill #2

## 2024-02-28 MED FILL — VENTOLIN HFA 90 MCG/ACTUATION AEROSOL INHALER: RESPIRATORY_TRACT | 17 days supply | Qty: 18 | Fill #1

## 2024-02-28 MED FILL — CELECOXIB 100 MG CAPSULE: ORAL | 90 days supply | Qty: 360 | Fill #1

## 2024-02-28 MED FILL — SYMBICORT 80 MCG-4.5 MCG/ACTUATION HFA AEROSOL INHALER: RESPIRATORY_TRACT | 30 days supply | Qty: 10.2 | Fill #1

## 2024-02-28 MED FILL — PROPYLTHIOURACIL 50 MG TABLET: ORAL | 50 days supply | Qty: 100 | Fill #1

## 2024-03-01 MED ORDER — QUETIAPINE 25 MG TABLET
ORAL_TABLET | 1 refills | 0.00 days | Status: CN
Start: 2024-03-01 — End: ?

## 2024-03-19 NOTE — Unmapped (Signed)
 Kristy Garcia has been contacted in regards to Kristy Garcia refill of BIKTARVY 50-200-25 mg tablet (bictegrav-emtricit-tenofov ala). At this time, they have declined refill due to  She has month left . Refill assessment call date has been updated per the patient's request.

## 2024-03-26 ENCOUNTER — Ambulatory Visit: Admit: 2024-03-26 | Payer: PRIVATE HEALTH INSURANCE

## 2024-03-26 NOTE — Unmapped (Unsigned)
 HIV-PULMONARY CLINIC VISIT:    Patient: Kristy Garcia([birth year])   Reason for visit: COPD    HISTORY OF PRESENT ILLNESS:     Patient is a 59 year old lady with a PMH as below including fibromyalgia, hyperthyroidism, MDD, HIV on ART, and COPD who presents today for evaluation and treatment of her COPD and tobacco use.     HISTORY OF PRESENT ILLNESS (07/2023):   Ms. Kristy Garcia reports that she believes she was diagnosed with COPD years ago, but was started on Symbicort within the last year. She currently uses that - 2 puffs once a day. Spiriva was added to her regimen about 3-4 months ago - 1 puff once a day. She notes that prior to starting these inhalers, she struggled with walking up a flight of stairs; however, she can now climb them without dyspnea. She also reports a significant decrease in her daily cough though she still has it occasionally; it is typically productive of clear sputum without blood. She does still have intermittent wheezing, which is more frequent at night. She reports infrequent use of her rescue albuterol, once every few days. She denies any chest pain, fevers, chills, or lower extremity swelling. She has required at least 3 course of oral prednisone for her breathing in the past year, that she can recall. She denies any history of childhood asthma or respiratory illnesses. She does report that breathing issues are often triggered by very strong smells such as perfume, and very hot or very cold temperatures. She does have seasonal allergies in the spring and summer - she manages these with an oral antihistamine, tylenol sinus, and flonase. She reports that she very rarely snores and has never been told that she gasps or chokes in her sleep. She generally wakes up well-rested but does need to take naps during the day. She denies any morning headaches. She does have uncontrolled acid reflux 3-4 times a week and was previously on a PPI but has not had it recently due to cost.     INTERVAL HISTORY (11/2023):  Ms. Kristy Garcia reports that she is overall doing well today. She reports that her dyspnea on exertion is stable from her last visit and her mMRC score today is 2. She does have a dry cough that has not increased in frequency. She denies chest pain, chest tightness, fevers, chills or lower extremity swelling. At present, she is using her Symbicort inhaler once a day, her albuterol inhaler once a day and her Spiriva inhaler as needed, typically 1-2 times a week. She reports that she has had R sided sinus pressure with headache (has been seen in IM clinic for evaluation) for the past three weeks with occasional nasal drainage, despite using her Flonase daily. She has not had any respiratory infections or required any courses of prednisone since her last visit. She has continued to cut down on smoking and is currently smoking 1 pack per week.     Medication Adherence: Currently using incorrectly  Inhaler Technique: Using inhaler properly.    Pulmonary medications: Spiriva, Symbicort, albuterol    HIV status:  Date: CD4 Viral load   03/2021 1248 ND   07/2022 1643 ND   06/2023 1355 <20     Current regimen: Biktarvy    REVIEW OF SYSTEMS: All systems were reviewed and found to be negative except as above in the HPI.    PAST MEDICAL HISTORY:     MEDICAL/SURGICAL HISTORY:  Past Medical History:   Diagnosis Date  Asthma     Bronchitis     Chest pain 01/23/2018    Dental caries     Depression     Depressive disorder 09/27/2013    R/o substance induced mood disorder    Fibromyalgia     HIV (human immunodeficiency virus infection)     HLD (hyperlipidemia)     Hypertension     Hypothyroidism     Palpitations 01/23/2018    Recurrent Candidal vulvovaginitis 03/11/2014    Reflux     Subacute frontal sinusitis 12/19/2023    Sudden onset of severe headache 12/02/2023     Past Surgical History:   Procedure Laterality Date    DILATION AND CURETTAGE OF UTERUS      EYE SURGERY Left     PR COLONOSCOPY FLX DX W/COLLJ SPEC WHEN PFRMD N/A 12/31/2016    Procedure: COLONOSCOPY, FLEXIBLE, PROXIMAL TO SPLENIC FLEXURE; DIAGNOSTIC, W/WO COLLECTION SPECIMEN BY BRUSH OR WASH;  Surgeon: Galvin Jules, MD;  Location: [facility];  Service: Gastroenterology    PR REPAIR ING HERNIA,5+Y/O,STRANG Left 04/17/2019    Procedure: PRIORITY REPAIR INITIAL INGUINAL HERNIA, AGE 41 YEARS OR OLDER; INCARCERATED OR STRANGULATED;  Surgeon: Netta Bar, MD;  Location: [facility] OR [facility];  Service: General Surgery       SOCIAL AND FAMILY HISTORY:   Social history: She is currently living with a friend temporarily and is applying for TCL housing. There are no pets in the home. 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 She started smoking at age 23 and at most, smoked 1 pack per day (42 pack-years). She has been using Chantix intermittently over the past 6 months and with this, has been able to cut down to 7-8 cpd. She reports no cravings with the Chantix. She reports prior inhaled cocaine use, last 10-20 years ago. She is currently smoking 2-5 cpd.     Family History: Mother, brother and son all have asthma. Colon cancer runs on both sides of the family.     MEDICATIONS AND ALLERGIES:     CURRENT MEDICATIONS:  Outpatient Encounter Medications as of 03/26/2024   Medication Sig Dispense Refill    acetaminophen (TYLENOL 8 HOUR) 650 MG CR tablet Take 2 tablets (1,300 mg total) by mouth in the morning.      albuterol HFA 90 mcg/actuation inhaler Inhale 2 puffs every four (4) hours as needed for wheezing. 18 g 11    amlodipine (NORVASC) 10 MG tablet Take 1 tablet (10 mg total) by mouth daily. 90 tablet 3    [EXPIRED] amoxicillin-clavulanate (AUGMENTIN) 875-125 mg per tablet Take 1 tablet by mouth two (2) times a day for 10 days. 20 tablet 0    aripiprazole (ABILIFY) 2 MG tablet Take 1 tablet (2 mg total) by mouth every morning.      [EXPIRED] azithromycin (ZITHROMAX) 250 MG tablet Take 2 tablets (500 mg total) by mouth daily for 1 day, THEN 1 tablet (250 mg total) daily for 4 days. 6 tablet 0    bictegrav-emtricit-tenofov ala (BIKTARVY) 50-200-25 mg tablet Take 1 tablet by mouth daily. 30 tablet 11    budesonide-formoterol (SYMBICORT) 80-4.5 mcg/actuation inhaler Inhale 2 puffs two (2) times a day. 6.9 g 5  celecoxib (CELEBREX) 100 MG capsule Take 2 capsules (200 mg total) by mouth two (2) times a day. 360 capsule 1    diclofenac sodium (VOLTAREN) 1 % gel Apply 2 grams topically four (4) times a day. 450 g 5    DULoxetine (CYMBALTA) 60 MG capsule Take 1 capsule (60 mg total) by mouth daily. 90 capsule 3    DULoxetine (CYMBALTA) 60 MG capsule Take 1 capsule every day by oral route at bedtime for 90 days. 90 capsule 1    [EXPIRED] fluconazole (DIFLUCAN) 150 MG tablet Take 1 tablet (150 mg total) by mouth once for 1 dose. May repeat after 3 days if persistent symptoms 2 tablet 0    fluticasone propionate (FLONASE) 50 mcg/actuation nasal spray Instill 1 spray into each nostril daily. 16 g 5    lidocaine (LIDODERM) 5 % patch Apply 1 patch to affected area for 12 hours only each day (then remove patch) 180 patch 1    loratadine (CLARITIN) 10 mg tablet Take 1 tablet (10 mg total) by mouth daily. 30 tablet 0    naloxone (NARCAN) 4 mg nasal spray One spray in either nostril once for known/suspected opioid overdose. May repeat every 2-3 minutes in alternating nostril til EMS arrives 2 each 0    nicotine polacrilex (NICORETTE) 2 mg gum Apply 1 each (2 mg total) to cheek every hour as needed for smoking cessation. 110 each 3    olmesartan (BENICAR) 20 MG tablet Take 1 tablet (20 mg total) by mouth daily. 90 tablet 3    omeprazole (PRILOSEC) 20 MG capsule Take 1 capsule (20 mg total) by mouth daily. 90 capsule 3 pravastatin (PRAVACHOL) 40 MG tablet Take 1 tablet (40 mg total) by mouth daily. 90 tablet 3    [EXPIRED] predniSONE (DELTASONE) 20 MG tablet Take 2 tablets (40 mg total) by mouth daily for 5 days. 10 tablet 0    pregabalin (LYRICA) 150 MG capsule Take 1 capsule (150 mg total) by mouth two (2) times a day. 180 capsule 1    propylthiouracil (PTU) 50 mg tablet Take 1 tablet (50 mg total) by mouth two (2) times a day. 60 tablet 5    QUEtiapine (SEROQUEL) 25 MG tablet Take 1 full tablet by mouth nightly. 90 tablet 1    tizanidine (ZANAFLEX) 2 MG tablet Take 1 tablet (2 mg total) by mouth every eight (8) hours as needed. 30 tablet 0    traZODone (DESYREL) 50 MG tablet Take 1-2 tablets by mouth as needed for sleep. 180 tablet 1    traZODone (DESYREL) 50 MG tablet Take 1 tablet (50 mg total) by mouth nightly. 90 tablet 3     No facility-administered encounter medications on file as of 03/26/2024.       ALLERGIES:  Allergies as of 03/26/2024 - Reviewed 02/22/2024   Allergen Reaction Noted    Codeine Nausea And Vomiting, Rash, and Hives 05/17/2013    Ibuprofen  04/17/2019    Adhesive tape-silicones  06/04/2021    Adhesive Itching 12/16/2017       PHYSICAL EXAM:     There were no vitals filed for this visit.     There is no height or weight on file to calculate BMI.    GENERAL: well nourished, cooperative, no acute distress  EYES: anicteric, noninjected, EOMi  HEENT: Neck supple, trachea midline, moist mucus membranes  CV: Regular rate, normal rhythm, no murmurs/rubs/gallops  PULM: Clear to auscultation bilaterally. No dullness to percussion. Normal excursion. Normal work  of breathing.   ABD: Soft, nontender, nondistended.   EXTREMITIES: No digital clubbing. No edema.  SKIN: No rashes, lesions, or skin breakdown. Warm and well perfused.  NEURO: No focal neurologic deficits. Moves all extremities and follows commands.   PSYCH: Well groomed, appropriate mood and affect, good eye contact.       ANCILLARY DATA:     Date: FVC (% Pred) FEV1 (% Pred)  Pre-BD FEV1 (%Pred)  Post- BD FEV1/FVC DLCO (% Pred) VC (% Pred) TLC (% Pred) RV (%Pred)   January 2025 2.72 (91%) 3.20 (88%) 2.16 (91%) 77 (68) 73% (-2.00)        CT LCS (07/2022): Mild centrilobular emphysema. Scattered right upper lobe granulomas. 44 right upper lobe nodule.     Echocardiogram (06/2023): LV normal size and wall thickness with normal systolic function. RV normal in size with normal systolic function.     All imaging and labs were reviewed personally.    ASSESSMENT AND PLAN:     Ms. Artus is a 58 year old lady with a PMH including fibromyalgia, hyperthyroidism, MDD, HIV on ART, and COPD who presents today for follow up of her COPD and tobacco use.     Although her PFTs today technically do not meet criteria for obstruction by ATS/ERS definitions, she does have some concavity of her expiratory loop that is suggestive of obstruction and she does have mild emphysema on her CT, which supports treating her as such. She is currently on triple therapy though I do wonder, especially given her mild airflow limitation, if she needs the ICS component. However, it is difficult to tell as she is not currently using her inhalers as prescribed. I'd like to get her onto confirmed triple therapy by consolidating her inhalers to once-daily Trelegy and if she has stable to improved symptoms of dyspnea and cough at her next visit, I'd like to step her down to a LAMA/LABA to avoid the side effects of an ICS. I will ask our HIV-COPD care partner to reach out to her to address any barriers to inhaler adherence in 2 weeks.     For her sinus congestion, I recommended that she start daily sinus irrigation. If this does not help, I would have a low threshold to treat with antibiotics and/or refer to ENT.     I congratulated her on continuing to reduce her smoking as she has cut down from 7-8 cpd to 2-5 cpd. She will continue on the Chantix and we will add low dose nicotine patch and gum to help her continue to cut down with the goal of stopping completely.     She is presently engaged in lung cancer screening and will be due again next month. Her primary ID provider has ordered the imaging and she plans to call and get scheduled.     She is up to date on her influenza, pneumonia, and TdaP vaccines.     I will have her follow up in 1 month by telephone to re-evaluate her tobacco treatment and 3 months in person for her COPD.     I personally spent 40 minutes face-to-face and non-face-to-face in the care of this patient, which includes all pre, intra, and post visit time on the date of service.  All documented time was specific to the E/M visit and does not include any procedures that may have been performed.

## 2024-03-27 ENCOUNTER — Ambulatory Visit: Admit: 2024-03-27 | Payer: PRIVATE HEALTH INSURANCE

## 2024-03-27 NOTE — Unmapped (Unsigned)
 Internal Medicine Clinic Visit    Reason for visit: ***    A/P:    ***  Essential hypertension (RAF-HCC)  Overview:  Pt on amlodipine 10 mg, olmesartan 20 mg   BP: 122/63  controlled  Lab Results   Component Value Date    CREATININE 0.79 12/09/2023     Plan cont meds        Chronic midline low back pain with bilateral sciatica  Overview:  On pregabalin 75 mg bid, gabapentin 600 mg at night, celebrex 100 mg bid, duloxetine 60 mg  Has been seen by ortho  MRI LS spine 12/2022:   -Mild neural foraminal stenosis at L5-S1 without high-grade neuroforaminal narrowing or canal stenosis elsewhere in the lumbar spine.   -Mild lumbar degenerative disc disease. Moderate facet arthropathy at L5-S1, mild elsewhere.   Plan: given fibromyalgia like syndrome, will increase duloxetine to 90 mg         Osteopenia, unspecified location  Overview:  2017 bone density scan:Lumbar spine: Normal bone density  Left proximal femur: The femoral neck density indicates mild osteopenia, but the total femoral density is normal.  Plan: recheck bone density, given osteopenia noted on hand xray, MSK complaints      Hyperthyroidism  Overview:  Followed by endocrinology. Taking 5 mg every day of methimazole   Lab Results   Component Value Date    TSH 0.497 (L) 12/28/2023     Plan: Refer back to endocrinology  Increase back to 10 mg of methimazole      Human immunodeficiency virus (HIV) disease  Overview:  2013-08 HIV dx by [facility] w/ multispot=indeterminate; HIV RNA = 235k  Currently on Biktarvy.  Followed by [facility]  Lab Results   Component Value Date    ACD4 1,378 06/03/2023    CD4 53 06/03/2023    HIVCP <20 07/22/2023    HIVRS Not Detected 06/03/2023     Plan: Continue current treatment.          Assessment & Plan          Health Maintenance:   Health Maintenance   Topic Date Due    Mammogram  09/17/2021    COVID-19 Vaccine (6 - 2024-25 season) 07/24/2023    Pap Smear with Cotest HPV (21-65)  03/31/2024    Serum Creatinine Monitoring  12/08/2024 Potassium Monitoring  12/08/2024    Lung Cancer Screening Shared Decision Making  01/06/2025    DTaP/Tdap/Td Vaccines (2 - Td or Tdap) 05/03/2026    HPV Cotest with Pap Smear (21-65)  08/18/2026    Colon Cancer Screening  12/31/2026    COPD Spirometry  12/18/2028    Pneumococcal Vaccine 50+  Completed    Hepatitis C Screen  Completed    Influenza Vaccine  Completed    Zoster Vaccines  Completed         No follow-ups on file.  Next Visit:   ***  No orders of the defined types were placed in this encounter.   __________________________________________________________    HPI:  Pt has htn, Graves, and HIV.   Seen in ED end of last month-treated for PNA with azithro, augmentin, pre  History of Present Illness        PTHomeBP   Lab Results   Component Value Date    ACD4 1,378 06/03/2023    CD4 53 06/03/2023    HIVCP <20 07/22/2023    HIVRS Not Detected 06/03/2023       __________________________________________________________    Problem List:  Patient Active Problem List   Diagnosis    Human immunodeficiency virus (HIV) disease    Hyperthyroidism    Suicide and self-inflicted injury    Tobacco use disorder    Cocaine use disorder, mild, in sustained remission, abuse (CMS-HCC)    Alcohol use disorder, moderate, in sustained remission    HIV (human immunodeficiency virus infection)    Essential hypertension (RAF-HCC)    Hyperlipidemia    Osteopenia    Sciatica of right side    Cervical dysplasia    Joint pain in both hands    Chronic pain of left ankle    Arthralgia of multiple sites, bilateral    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  Wt Readings from Last 3 Encounters:   02/22/24 74.4 kg (164 lb)   12/28/23 74.8 kg (164 lb 12.8 oz)   12/19/23 73.8 kg (162 lb 11.2 oz)     Physical Exam        Gen: Well appearing, NAD  CV: RRR, no murmurs  Pulm: CTA bilaterally, no crackles or wheezes  Abd: Soft, NTND, normal BS. No HSM.  Ext: No edema  MSK:***  Psych:***  ***  Records review***  Last   Lab Results   Component Value Date    CREATININE 0.79 12/09/2023    CHOL 138 08/18/2023    HDL 40 08/18/2023    LDL 66 08/18/2023    NONHDL 98 08/18/2023    TRIG 161 (H) 08/18/2023    A1C 6.2 (H) 12/28/2023        The 10-year ASCVD risk score (Arnett DK, et al., 2019) is: 9.1%   Medication adherence and barriers to the treatment plan have been addressed. Opportunities to optimize healthy behaviors have been discussed. Patient / caregiver voiced understanding.    {TIP - HCC- RAFF Pilot- Clinical Documentation Specialist Recommendations-  No specialty comments available.   This text will self delete upon signing note:[id number]}     I personally spent *** minutes face-to-face and non-face-to-face in the care of this patient, which includes all pre, intra, and post visit time on the date of service.

## 2024-03-30 NOTE — Unmapped (Signed)
 [facility] Clinical Assessment & Refill Coordination Note    Kristy Kristy Garcia has been contacted in regards to their refill of Biktarvy. At this time, they have declined refill due to patient having 29 doses remaining. Refill assessment call date has been updated per the patient's request.    Will send refills of non-specialty meds.      Kristy Kristy Garcia, DOB: May 19, 1966  Phone: There are no phone numbers on file.    All above HIPAA information was verified with patient.     Was a Nurse, learning disability used for this call? No    Specialty Medication(s):   Infectious Disease: Biktarvy     Current Outpatient Medications   Medication Sig Dispense Refill    acetaminophen (TYLENOL 8 HOUR) 650 MG CR tablet Take 2 tablets (1,300 mg total) by mouth in the morning.      albuterol HFA 90 mcg/actuation inhaler Inhale 2 puffs every four (4) hours as needed for wheezing. 18 g 11    amlodipine (NORVASC) 10 MG tablet Take 1 tablet (10 mg total) by mouth daily. 90 tablet 3    aripiprazole (ABILIFY) 2 MG tablet Take 1 tablet (2 mg total) by mouth every morning.      bictegrav-emtricit-tenofov ala (BIKTARVY) 50-200-25 mg tablet Take 1 tablet by mouth daily. 30 tablet 11    budesonide-formoterol (SYMBICORT) 80-4.5 mcg/actuation inhaler Inhale 2 puffs two (2) times a day. 6.9 g 5    celecoxib (CELEBREX) 100 MG capsule Take 2 capsules (200 mg total) by mouth two (2) times a day. 360 capsule 1    diclofenac sodium (VOLTAREN) 1 % gel Apply 2 grams topically four (4) times a day. 450 g 5    DULoxetine (CYMBALTA) 60 MG capsule Take 1 capsule (60 mg total) by mouth daily. 90 capsule 3    DULoxetine (CYMBALTA) 60 MG capsule Take 1 capsule every day by oral route at bedtime for 90 days. 90 capsule 1    fluticasone propionate (FLONASE) 50 mcg/actuation nasal spray Instill 1 spray into each nostril daily. 16 g 5    lidocaine (LIDODERM) 5 % patch Apply 1 patch to affected area for 12 hours only each day (then remove patch) 180 patch 1    loratadine (CLARITIN) 10 mg tablet Take 1 tablet (10 mg total) by mouth daily. 30 tablet 0    naloxone (NARCAN) 4 mg nasal spray One spray in either nostril once for known/suspected opioid overdose. May repeat every 2-3 minutes in alternating nostril til EMS arrives 2 each 0    nicotine polacrilex (NICORETTE) 2 mg gum Apply 1 each (2 mg total) to cheek every hour as needed for smoking cessation. 110 each 3    olmesartan (BENICAR) 20 MG tablet Take 1 tablet (20 mg total) by mouth daily. 90 tablet 3    omeprazole (PRILOSEC) 20 MG capsule Take 1 capsule (20 mg total) by mouth daily. 90 capsule 3    pravastatin (PRAVACHOL) 40 MG tablet Take 1 tablet (40 mg total) by mouth daily. 90 tablet 3    pregabalin (LYRICA) 150 MG capsule Take 1 capsule (150 mg total) by mouth two (2) times a day. 180 capsule 1    propylthiouracil (PTU) 50 mg tablet Take 1 tablet (50 mg total) by mouth two (2) times a day. 60 tablet 5    QUEtiapine (SEROQUEL) 25 MG tablet Take 1 full tablet by mouth nightly. 90 tablet 1    tizanidine (ZANAFLEX) 2 MG tablet Take  1 tablet (2 mg total) by mouth every eight (8) hours as needed. 30 tablet 0    traZODone (DESYREL) 50 MG tablet Take 1-2 tablets by mouth as needed for sleep. 180 tablet 1    traZODone (DESYREL) 50 MG tablet Take 1 tablet (50 mg total) by mouth nightly. 90 tablet 3     No current facility-administered medications for this visit.        Changes to medications: Kristy Kristy Garcia no changes at this time.    Medication list has been reviewed and updated in Epic: Yes    Allergies   Allergen Reactions    Codeine Nausea And Vomiting, Rash and Hives    Ibuprofen      Pt Kristy Garcia  I cannot take this.  I have ulcers    Adhesive Tape-Silicones     Adhesive Itching       Changes to allergies: No    Allergies have been reviewed and updated in Epic: Yes    SPECIALTY MEDICATION ADHERENCE     Biktarvy 50-200-25 mg: 29 doses of medicine on hand       Medication Adherence    Patient reported X missed doses in the last month: 0  Specialty Medication: Biktarvy 50-200-25mg  QD  Patient is on additional specialty medications: No  Informant: patient          Specialty medication(s) dose(s) confirmed: Regimen is correct and unchanged.     Are there any concerns with adherence? No    Adherence counseling provided? Not needed    CLINICAL MANAGEMENT AND INTERVENTION      Clinical Benefit Assessment:    Do you feel the medicine is effective or helping your condition? Patient declined to answer    Clinical Benefit counseling provided? Labs from 06/2023 show evidence of clinical benefit    Adverse Effects Assessment:    Are you experiencing any side effects? No    Are you experiencing difficulty administering your medicine? No    Quality of Life Assessment:    Quality of Life    Rheumatology  Oncology  Dermatology  Cystic Fibrosis          How many days over the past month did your HIV  keep you from your normal activities? For example, brushing your teeth or getting up in the morning. Patient declined to answer    Have you discussed this with your provider? Not needed    Acute Infection Status:    Acute infections noted within Epic:  No active infections    Patient reported infection: None    Therapy Appropriateness:    Is therapy appropriate based on current medication list, adverse reactions, adherence, clinical benefit and progress toward achieving therapeutic goals? Yes, therapy is appropriate and should be continued     Clinical Intervention:    Was an intervention completed as part of this clinical assessment? No    DISEASE/MEDICATION-SPECIFIC INFORMATION      N/A    HIV: Not Applicable    HIV ASSOCIATED LABS:     Lab Results   Component Value Date/Time    HIVRS Not Detected 06/03/2023 02:44 PM    HIVRS Not Detected 12/31/2022 01:48 PM    HIVRS Not Detected 07/30/2022 02:26 PM    HIVRS Not Detected 08/15/2014 12:38 PM    HIVRS Not Detected 02/07/2014 10:56 AM    HIVRS Not Detected 10/01/2013 12:56 PM HIVCP <20 07/22/2023 12:00 AM    HIVCP 228 (H) 07/19/2016 11:20 AM    HIVCP <  40 12/14/2012 11:46 AM    HIVCP 60454 08/31/2012 02:40 PM    ACD4 1,378 06/03/2023 02:44 PM    ACD4 1,643 07/30/2022 02:26 PM    ACD4 1,664 02/26/2022 04:38 PM    ACD4 970 08/15/2014 12:38 PM    ACD4 1,296 02/07/2014 10:56 AM    ACD4 1,490 10/01/2013 12:56 PM         PATIENT SPECIFIC NEEDS     Does the patient have any physical, cognitive, or cultural barriers? No    Is the patient high risk? No    Does the patient require physician intervention or other additional services (i.e., nutrition, smoking cessation, social work)? No    Does the patient have an additional or emergency contact listed in their chart? Yes    SOCIAL DETERMINANTS OF HEALTH     At the [facility], we have learned that life circumstances - like trouble affording food, housing, utilities, or transportation can affect the health of many of our patients.   That is why we wanted to ask: are you currently experiencing any life circumstances that are negatively impacting your health and/or quality of life? Patient declined to answer    Social Drivers of Health     Food Insecurity: Food Insecurity Present (08/18/2023)    Hunger Vital Sign     Worried About Running Out of Food in the Last Year: Never true     Ran Out of Food in the Last Year: Sometimes true   Tobacco Use: High Risk (02/03/2024)    Patient History     Smoking Tobacco Use: Every Day     Smokeless Tobacco Use: Former     Passive Exposure: Not on file   Transportation Needs: Unmet Transportation Needs (08/18/2023)    PRAPARE - Therapist, art (Medical): Yes     Lack of Transportation (Non-Medical): Yes   Alcohol Use: Not At Risk (08/18/2023)    Alcohol Use     How often do you have a drink containing alcohol?: Monthly or less     How many drinks containing alcohol do you have on a typical day when you are drinking?: 1 - 2     How often do you have 5 or more drinks on one occasion?: Never   Housing: High Risk (08/18/2023)    Housing     Within the past 12 months, have you ever stayed: outside, in a car, in a tent, in an overnight shelter, or temporarily in someone else's home (i.e. couch-surfing)?: No     Are you worried about losing your housing?: Yes   Physical Activity: Insufficiently Active (08/18/2023)    Exercise Vital Sign     Days of Exercise per Week: 1 day     Minutes of Exercise per Session: 30 min   Utilities: Low Risk  (08/18/2023)    Utilities     Within the past 12 months, have you been unable to get utilities (heat, electricity) when it was really needed?: No   Stress: No Stress Concern Present (08/18/2023)    [employer] of Occupational Health - Occupational Stress Questionnaire     Feeling of Stress : Not at all   Interpersonal Safety: Not At Risk (08/18/2023)    Interpersonal Safety     Unsafe Where You Currently Live: No     Physically Hurt by Anyone: No     Abused by Anyone: No   Substance Use: Low Risk  (08/18/2023)  Substance Use     In the past year, how often have you used prescription drugs for non-medical reasons?: Never     In the past year, how often have you used illegal drugs?: Never     In the past year, have you used any substance for non-medical reasons?: No   Intimate Partner Violence: Not At Risk (08/18/2023)    Humiliation, Afraid, Rape, and Kick questionnaire     Fear of Current or Ex-Partner: No     Emotionally Abused: No     Physically Abused: No     Sexually Abused: No   Social Connections: Socially Isolated (08/18/2023)    Social Connection and Isolation Panel [NHANES]     Frequency of Communication with Friends and Family: More than three times a week     Frequency of Social Gatherings with Friends and Family: More than three times a week     Attends Religious Services: Never     Database administrator or Organizations: No     Attends Engineer, structural: Never     Marital Status: Never married   Physicist, medical Strain: High Risk (08/18/2023)    Overall Financial Resource Strain (CARDIA)     Difficulty of Paying Living Expenses: Very hard   Health Literacy: Medium Risk (08/18/2023)    Health Literacy     : Rarely   Internet Connectivity: No Internet connectivity concern identified (08/18/2023)    Internet Connectivity     Do you have access to internet services: Yes     How do you connect to the internet: Personal Device at home     Is your internet connection strong enough for you to watch video on your device without major problems?: Yes     Do you have enough data to get through the month?: Yes     Does at least one of the devices have a camera that you can use for video chat?: Yes       Would you be willing to receive help with any of the needs that you have identified today? Not applicable       SHIPPING     Specialty Medication(s) to be Shipped:   Infectious Disease: NONE    Other medication(s) to be shipped: amlodipine, lidocaine patches, olmesartan, omeprazole, propylthiouracil, Symbicort, Ventolin, trazodone     Changes to insurance: No    Cost and Payment: $32 but medicaid copays - patient unable to pay    Delivery Scheduled: Yes, Expected medication delivery date:  04/03/24 (all others), 04/09/24 (propylthiouracil).     Medication will be delivered via UPS to the confirmed prescription address in [facility].    The patient will receive a drug information handout for each medication shipped and additional FDA Medication Guides as required.  Verified that patient has previously received a Conservation officer, historic buildings and a Surveyor, mining.    The patient or caregiver noted above participated in the development of this care plan and knows that they can request review of or adjustments to the care plan at any time.      All of the patient's questions and concerns have been addressed.    Riesa Chary, PharmD   [facility] Specialty Pharmacist

## 2024-04-02 MED FILL — LIDOCAINE 5 % TOPICAL PATCH: TRANSDERMAL | 30 days supply | Qty: 30 | Fill #3

## 2024-04-02 MED FILL — SYMBICORT 80 MCG-4.5 MCG/ACTUATION HFA AEROSOL INHALER: RESPIRATORY_TRACT | 30 days supply | Qty: 10.2 | Fill #2

## 2024-04-02 MED FILL — AMLODIPINE 10 MG TABLET: ORAL | 90 days supply | Qty: 90 | Fill #2

## 2024-04-02 MED FILL — VENTOLIN HFA 90 MCG/ACTUATION AEROSOL INHALER: RESPIRATORY_TRACT | 17 days supply | Qty: 18 | Fill #2

## 2024-04-02 MED FILL — OLMESARTAN 20 MG TABLET: ORAL | 90 days supply | Qty: 90 | Fill #2

## 2024-04-02 MED FILL — OMEPRAZOLE 20 MG CAPSULE,DELAYED RELEASE: ORAL | 90 days supply | Qty: 90 | Fill #1

## 2024-04-06 MED FILL — PROPYLTHIOURACIL 50 MG TABLET: ORAL | 50 days supply | Qty: 100 | Fill #2

## 2024-04-25 NOTE — Unmapped (Signed)
 The [facility] has made a second and final attempt to reach this patient to refill the following medication:BIKTARVY 50-200-25 mg tablet (bictegrav-emtricit-tenofov ala).      We have left voicemails on the following phone numbers: [phone], have sent a MyChart message, and have sent a text message to the following phone numbers: [phone].    Dates contacted: 04/18/2024 and 04/25/2024  Last scheduled delivery: 02/28/2024    The patient may be at risk of non-compliance with this medication. The patient should call the [facility] at [phone]  Option 4, then Option 4: Infectious Disease, Transplant to refill medication.    Lanny Plan   [facility] Delivery Oncologist

## 2024-05-02 ENCOUNTER — Ambulatory Visit: Admit: 2024-05-02 | Discharge: 2024-05-03 | Payer: PRIVATE HEALTH INSURANCE

## 2024-05-02 DIAGNOSIS — F5101 Primary insomnia: Principal | ICD-10-CM

## 2024-05-02 DIAGNOSIS — F17200 Nicotine dependence, unspecified, uncomplicated: Principal | ICD-10-CM

## 2024-05-02 DIAGNOSIS — F331 Major depressive disorder, recurrent, moderate: Principal | ICD-10-CM

## 2024-05-02 DIAGNOSIS — Z1151 Encounter for screening for human papillomavirus (HPV): Principal | ICD-10-CM

## 2024-05-02 DIAGNOSIS — B20 Human immunodeficiency virus [HIV] disease: Principal | ICD-10-CM

## 2024-05-02 DIAGNOSIS — Z79899 Other long term (current) drug therapy: Principal | ICD-10-CM

## 2024-05-02 DIAGNOSIS — E0590 Thyrotoxicosis, unspecified without thyrotoxic crisis or storm: Principal | ICD-10-CM

## 2024-05-02 DIAGNOSIS — Z124 Encounter for screening for malignant neoplasm of cervix: Principal | ICD-10-CM

## 2024-05-02 DIAGNOSIS — J302 Other seasonal allergic rhinitis: Principal | ICD-10-CM

## 2024-05-02 DIAGNOSIS — Z1231 Encounter for screening mammogram for malignant neoplasm of breast: Principal | ICD-10-CM

## 2024-05-02 LAB — TSH: THYROID STIMULATING HORMONE: 0.763 u[IU]/mL (ref 0.550–4.780)

## 2024-05-02 MED ORDER — DULOXETINE 60 MG CAPSULE,DELAYED RELEASE
ORAL_CAPSULE | ORAL | 3 refills | 0.00000 days | Status: CP
Start: 2024-05-02 — End: 2025-05-02
  Filled 2024-06-12: qty 90, 90d supply, fill #0

## 2024-05-02 MED ORDER — QUETIAPINE 25 MG TABLET
ORAL_TABLET | ORAL | 3 refills | 0.00000 days | Status: CP
Start: 2024-05-02 — End: ?
  Filled 2024-05-08: qty 90, 90d supply, fill #0

## 2024-05-02 MED ORDER — VARENICLINE TARTRATE 1 MG TABLET
ORAL_TABLET | Freq: Two times a day (BID) | ORAL | 2 refills | 30.00000 days | Status: CP
Start: 2024-05-02 — End: ?

## 2024-05-02 MED ORDER — BIKTARVY 50 MG-200 MG-25 MG TABLET
ORAL_TABLET | Freq: Every day | ORAL | 11 refills | 30.00000 days | Status: CP
Start: 2024-05-02 — End: 2025-05-02
  Filled 2024-05-08: qty 30, 30d supply, fill #0

## 2024-05-02 MED ORDER — LORATADINE 10 MG TABLET
ORAL_TABLET | Freq: Every day | ORAL | 0 refills | 90.00000 days | Status: CP
Start: 2024-05-02 — End: 2025-05-02
  Filled 2024-05-08: qty 30, 30d supply, fill #0

## 2024-05-02 MED ORDER — TRAZODONE 50 MG TABLET
ORAL_TABLET | Freq: Every evening | ORAL | 3 refills | 90.00000 days | Status: CP
Start: 2024-05-02 — End: 2025-05-02
  Filled 2024-05-08: qty 180, 90d supply, fill #1

## 2024-05-02 NOTE — Unmapped (Signed)
 Internal Medicine Clinic Visit    Reason for visit: follow up    A/P:         Lab Results   Component Value Date    TSH 0.763 05/02/2024       Assessment & Plan  Left Hip and Back Pain  Chronic left hip and back pain with radiation to both legs and tingling in feet. Pain exacerbated by movement and improved with steroids. She is considering injections for pain management, pending MRI results (MRI not yet ordered).  -   - Pt will call to spine center where she has been seen before  - Discuss potential for pain management options including injections post-MRI    Hypertension  Blood pressure is well-controlled on current regimen. Of olmesartan 20 mg    HIV  Stable on Biktarvy. She is almost out of medication and requires a refill.  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 Thyroid function needs to be monitored.  - Order thyroid function test    Chronic Headaches  Headaches have improved significantly. Triggered by certain environmental factors such as incidents in the car. Previously, I have been concerned about Graves ophthalmopathy    Depression and Anxiety  Mood is generally stable on current medications (duloxetine, Seroquel, and possibly Abilify). She has not seen psychiatry team recently and requires refills for psychiatric medications.  - Refill Seroquel, duloxetine, and trazodone prescriptions  - Encourage follow-up with psychiatry team    Smoking Cessation  She has reduced smoking significantly. Previous use of Chantix was helpful.  - Renew Chantix prescription to aid in smoking cessation        Health Maintenance:   Health Maintenance   Topic Date Due    Mammogram  09/17/2021    COVID-19 Vaccine (6 - 2024-25 season) 07/24/2023    Pap Smear with Cotest HPV (21-65)  03/31/2024    Serum Creatinine Monitoring  12/08/2024    Potassium Monitoring  12/08/2024    Lung Cancer Screening Shared Decision Making  01/06/2025    DTaP/Tdap/Td Vaccines (2 - Td or Tdap) 05/03/2026    Colon Cancer Screening  12/31/2026    HPV Cotest with Pap Smear (21-65)  05/03/2027    COPD Spirometry  12/18/2028    Pneumococcal Vaccine 50+  Completed    Hepatitis C Screen  Completed    Influenza Vaccine  Completed    Zoster Vaccines  Completed   Pap test done       Return in about 3 months (around 08/02/2024).    Orders Placed This Encounter   Procedures    HPV DNA HI RISK    Mammo Digital Screening Bilateral    TSH      __________________________________________________________    HPI:  Pt has htn, hyperthyroid, and HIV.     History of Present Illness  Kristy Garcia is a 58 year old female with hypertension, HIV, and hyperthyroidism from Graves' disease who presents with chronic hip and back pain.    She has been experiencing severe pain in her left hip for approximately three weeks, significantly impairing her ability to walk. X-rays were performed at an orthopedic clinic in [place], and she was prescribed steroids, which provided significant pain relief. Despite this, the pain persists, especially during movement, and radiates down to her feet, accompanied by tingling in both feet. An MRI is due as it has been a year since the last one.  She is currently taking duloxetine 60 mg and Seroquel 25 mg and trazodone 50 mg , and requires refills for these medications. She also takes Radio producer for HIV and is almost out of it. She is on thyroid medication. Her headaches have improved significantly, though they are triggered by certain incidents, such as riding in a car.    She has a history of smoking but reports significant improvement, with a pack lasting three to four days. She has previously tried Chantix to help quit smoking, which was effective.    Her mood is affected by pain but is generally stable with her current medication regimen. She has not seen her psychiatry team recently due to circumstances but plans to reschedule. She also reports a recent bereavement, as a friend passed away from cancer.      PTHomeBP   Lab Results   Component Value Date    ACD4 1,378 06/03/2023    CD4 53 06/03/2023    HIVCP <20 07/22/2023    HIVRS Not Detected 06/03/2023       __________________________________________________________    Problem List:  Problem List[1]    Medications:  Reviewed in EPIC    _________________________________________________________    Physical Exam:   Vital Signs:  Vitals:    05/02/24 0953   BP: 130/66   BP Site: L Arm   BP Position: Sitting   Pulse: 65   Resp: 16   Temp: 36.5 ??C (97.7 ??F)   TempSrc: Oral   SpO2: 98%   Weight: 74.7 kg (164 lb 9.6 oz)     Wt Readings from Last 3 Encounters:   05/02/24 74.7 kg (164 lb 9.6 oz)   02/22/24 74.4 kg (164 lb)   12/28/23 74.8 kg (164 lb 12.8 oz)     Physical Exam  GENITOURINARY: Cervix normal.  MUSCULOSKELETAL: Midline back tenderness.  Gait: normal   Pelvic: exam chaperoned by nurse, normal vagina and vulva, pelvic exam is limited due to obesity, normal cervix without lesions, polyps or tenderness, and pap smear done today   Records review  Last   Lab Results   Component Value Date    CREATININE 0.79 12/09/2023    CHOL 138 08/18/2023    HDL 40 08/18/2023    LDL 66 08/18/2023    NONHDL 98 08/18/2023    TRIG 837 (H) 08/18/2023    A1C 6.2 (H) 12/28/2023        The 10-year ASCVD risk score (Arnett DK, et al., 2019) is: 11%   Medication adherence and barriers to the treatment plan have been addressed. Opportunities to optimize healthy behaviors have been discussed. Patient / caregiver voiced understanding.         I personally spent 28 minutes face-to-face and non-face-to-face in the care of this patient, which includes all pre, intra, and post visit time on the date of service.         [1]   Patient Active Problem List  Diagnosis    Human immunodeficiency virus (HIV) disease       Hyperthyroidism    Suicide and self-inflicted injury       Tobacco use disorder    Cocaine use disorder, mild, in sustained remission, abuse (CMS-HCC)    Alcohol use disorder, moderate, in sustained remission       HIV (human immunodeficiency virus infection)       Essential hypertension (RAF-HCC)    Hyperlipidemia    Osteopenia    Sciatica of right side    Cervical dysplasia  Joint pain in both hands    Chronic pain of left ankle    Arthralgia of multiple sites, bilateral    Moderate episode of recurrent major depressive disorder (CMS-HCC)    Colon cancer screening    Chronic midline low back pain with bilateral sciatica    Primary osteoarthritis of both hands    Chronic pain syndrome    Encounter for long-term (current) use of high-risk medication    Myalgia, multiple sites    Centrilobular emphysema       Blurred vision    Right sided temporal headache

## 2024-05-03 NOTE — Unmapped (Signed)
 [facility] Refill Coordination Note    Specialty Medication(s) to be Shipped:   Infectious Disease: Biktarvy    Other medication(s) to be shipped: fluticasone, lidocaine, loratadine, pravastatin, pregabalin, quetiapine, symbicort, trazadone, ventolin      Kristy Garcia, DOB: 04/04/1966  Phone: There are no phone numbers on file.      All above HIPAA information was verified with patient.     Was a Nurse, learning disability used for this call? No    Completed refill call assessment today to schedule patient's medication shipment from the [facility]  [phone]).  All relevant notes have been reviewed.     Specialty medication(s) and dose(s) confirmed: Regimen is correct and unchanged.   Changes to medications: Diamonique reports no changes at this time.  Changes to insurance: No  New side effects reported not previously addressed with a pharmacist or physician: None reported  Questions for the pharmacist: No    Confirmed patient received a Conservation officer, historic buildings and a Surveyor, mining with first shipment. The patient will receive a drug information handout for each medication shipped and additional FDA Medication Guides as required.       DISEASE/MEDICATION-SPECIFIC INFORMATION        N/A    SPECIALTY MEDICATION ADHERENCE     Medication Adherence    Patient reported X missed doses in the last month: 0  Specialty Medication: BIKTARVY 50-200-25 mg tablet (bictegrav-emtricit-tenofov ala)  Patient is on additional specialty medications: No              Were doses missed due to medication being on hold? No    BIKTARVY 50-200-25 mg tablet (bictegrav-emtricit-tenofov ala) 4 days of medicine on hand     REFERRAL TO PHARMACIST     Referral to the pharmacist: Not needed      [facility]     Shipping address confirmed in Epic.     Cost and Payment: Patient has a copay of $38.99. They are aware and have authorized the pharmacy to charge the credit card on file.    Delivery Scheduled: Yes, Expected medication delivery date: 05/08/2024.     Medication will be delivered via Same Day Courier to the prescription address in Epic WAM.    [facility]  Specialty Technician

## 2024-05-08 MED FILL — PRAVASTATIN 40 MG TABLET: ORAL | 90 days supply | Qty: 90 | Fill #2

## 2024-05-08 MED FILL — VENTOLIN HFA 90 MCG/ACTUATION AEROSOL INHALER: RESPIRATORY_TRACT | 17 days supply | Qty: 18 | Fill #3

## 2024-05-08 MED FILL — FLUTICASONE PROPIONATE 50 MCG/ACTUATION NASAL SPRAY,SUSPENSION: NASAL | 60 days supply | Qty: 16 | Fill #3

## 2024-05-08 MED FILL — LIDOCAINE 5 % TOPICAL PATCH: TRANSDERMAL | 30 days supply | Qty: 30 | Fill #4

## 2024-05-08 MED FILL — SYMBICORT 80 MCG-4.5 MCG/ACTUATION HFA AEROSOL INHALER: RESPIRATORY_TRACT | 30 days supply | Qty: 10.2 | Fill #3

## 2024-05-08 MED FILL — PREGABALIN 150 MG CAPSULE: ORAL | 90 days supply | Qty: 180 | Fill #1

## 2024-06-07 DIAGNOSIS — R059 Cough, unspecified type: Principal | ICD-10-CM

## 2024-06-07 DIAGNOSIS — M2550 Pain in unspecified joint: Principal | ICD-10-CM

## 2024-06-07 DIAGNOSIS — G8929 Other chronic pain: Principal | ICD-10-CM

## 2024-06-07 DIAGNOSIS — M25572 Pain in left ankle and joints of left foot: Principal | ICD-10-CM

## 2024-06-07 DIAGNOSIS — M5441 Lumbago with sciatica, right side: Principal | ICD-10-CM

## 2024-06-07 DIAGNOSIS — G894 Chronic pain syndrome: Principal | ICD-10-CM

## 2024-06-07 DIAGNOSIS — Z79899 Other long term (current) drug therapy: Principal | ICD-10-CM

## 2024-06-07 DIAGNOSIS — E0590 Thyrotoxicosis, unspecified without thyrotoxic crisis or storm: Principal | ICD-10-CM

## 2024-06-07 DIAGNOSIS — M5442 Lumbago with sciatica, left side: Principal | ICD-10-CM

## 2024-06-07 MED ORDER — PROPYLTHIOURACIL 50 MG TABLET
ORAL_TABLET | Freq: Two times a day (BID) | ORAL | 5 refills | 30.00000 days | Status: CP
Start: 2024-06-07 — End: 2024-12-04
  Filled 2024-06-12: qty 100, 50d supply, fill #0

## 2024-06-07 MED ORDER — BUDESONIDE-FORMOTEROL HFA 80 MCG-4.5 MCG/ACTUATION AEROSOL INHALER
Freq: Two times a day (BID) | RESPIRATORY_TRACT | 5 refills | 21.00000 days | Status: CP
Start: 2024-06-07 — End: 2025-06-07

## 2024-06-07 MED ORDER — CELECOXIB 100 MG CAPSULE
ORAL_CAPSULE | Freq: Two times a day (BID) | ORAL | 1 refills | 90.00000 days | Status: CP
Start: 2024-06-07 — End: 2024-12-04
  Filled 2024-06-12: qty 360, 90d supply, fill #0

## 2024-06-07 NOTE — Unmapped (Signed)
 The [facility] has made a second and final attempt to reach this patient to refill the following medication:BIKTARVY  50-200-25 mg tablet (bictegrav-emtricit-tenofov ala).      We have left voicemails on the following phone numbers: [phone], have sent a MyChart message, and have sent a text message to the following phone numbers: [phone].    Dates contacted: 06/01/2024 and 06/07/2024  Last scheduled delivery: 05/08/2024    The patient may be at risk of non-compliance with this medication. The patient should call the [facility] at [phone]  Option 4, then Option 4: Infectious Disease, Transplant to refill medication.    Lucie HERO Jaylinn Hellenbrand    Specialty and Home Delivery Oncologist

## 2024-06-07 NOTE — Unmapped (Signed)
 [facility] Refill Coordination Note    Specialty Medication(s) to be Shipped:   Infectious Disease: Biktarvy     Other medication(s) to be shipped: Amlodipine , Symbicort , Celecoxib , Duloxetine , Lidocaine  patch, Loratadine , Olmesartan , Omeprazole , Propythiouracil, Ventolin      Kristy Garcia, DOB: [birth year]  Phone: There are no phone numbers on file.      All above HIPAA information was verified with patient.     Was a Nurse, learning disability used for this call? No    Completed refill call assessment today to schedule patient's medication shipment from the [facility]  [phone]).  All relevant notes have been reviewed.     Specialty medication(s) and dose(s) confirmed: Regimen is correct and unchanged.   Changes to medications: [name] reports no changes at this time.  Changes to insurance: No  New side effects reported not previously addressed with a pharmacist or physician: None reported  Questions for the pharmacist: No    Confirmed patient received a Conservation officer, historic buildings and a Surveyor, mining with first shipment. The patient will receive a drug information handout for each medication shipped and additional FDA Medication Guides as required.       DISEASE/MEDICATION-SPECIFIC INFORMATION        N/A    SPECIALTY MEDICATION ADHERENCE     Medication Adherence    Patient reported X missed doses in the last month: 0  Specialty Medication: BIKTARVY  50-200-25 mg tablet (bictegrav-emtricit-tenofov ala)  Patient is on additional specialty medications: No              Were doses missed due to medication being on hold? No    Biktarvy  50-200-25 mg: 5 days of medicine on hand        REFERRAL TO PHARMACIST     Referral to the pharmacist: Not needed      [facility]     Shipping address confirmed in Epic.     Cost and Payment: pt unable to pay copays (medicaid)    Delivery Scheduled: Yes, Expected medication delivery date: 06/11/24.     Medication will be delivered via Same Day Courier to the prescription address in Epic [place].    [name]   [facility]  Specialty Technician

## 2024-06-12 MED FILL — BIKTARVY 50 MG-200 MG-25 MG TABLET: ORAL | 30 days supply | Qty: 30 | Fill #1

## 2024-06-12 MED FILL — OMEPRAZOLE 20 MG CAPSULE,DELAYED RELEASE: ORAL | 90 days supply | Qty: 90 | Fill #2

## 2024-06-12 MED FILL — SYMBICORT 80 MCG-4.5 MCG/ACTUATION HFA AEROSOL INHALER: RESPIRATORY_TRACT | 31 days supply | Qty: 10.2 | Fill #0

## 2024-06-12 MED FILL — VENTOLIN HFA 90 MCG/ACTUATION AEROSOL INHALER: RESPIRATORY_TRACT | 17 days supply | Qty: 18 | Fill #4

## 2024-06-12 MED FILL — LORATADINE 10 MG TABLET: ORAL | 30 days supply | Qty: 30 | Fill #1

## 2024-06-12 MED FILL — AMLODIPINE 10 MG TABLET: ORAL | 90 days supply | Qty: 90 | Fill #3

## 2024-06-12 MED FILL — OLMESARTAN 20 MG TABLET: ORAL | 90 days supply | Qty: 90 | Fill #3

## 2024-06-14 MED FILL — LIDOCAINE 5 % TOPICAL PATCH: TRANSDERMAL | 30 days supply | Qty: 30 | Fill #5

## 2024-06-18 ENCOUNTER — Encounter: Admit: 2024-06-18 | Discharge: 2024-06-18 | Payer: PRIVATE HEALTH INSURANCE | Attending: Family | Primary: Family

## 2024-06-18 ENCOUNTER — Inpatient Hospital Stay: Admit: 2024-06-18 | Discharge: 2024-06-18 | Payer: PRIVATE HEALTH INSURANCE

## 2024-06-18 DIAGNOSIS — M79672 Pain in left foot: Principal | ICD-10-CM

## 2024-06-18 DIAGNOSIS — J4489 Asthma-COPD overlap syndrome: Principal | ICD-10-CM

## 2024-06-18 DIAGNOSIS — Z720 Tobacco use: Principal | ICD-10-CM

## 2024-06-18 DIAGNOSIS — W19XXXD Unspecified fall, subsequent encounter: Principal | ICD-10-CM

## 2024-06-18 DIAGNOSIS — B20 Human immunodeficiency virus [HIV] disease: Principal | ICD-10-CM

## 2024-06-18 DIAGNOSIS — Z5181 Encounter for therapeutic drug level monitoring: Principal | ICD-10-CM

## 2024-06-18 DIAGNOSIS — K0889 Other specified disorders of teeth and supporting structures: Principal | ICD-10-CM

## 2024-06-18 DIAGNOSIS — F17200 Nicotine dependence, unspecified, uncomplicated: Principal | ICD-10-CM

## 2024-06-18 DIAGNOSIS — Z9189 Other specified personal risk factors, not elsewhere classified: Principal | ICD-10-CM

## 2024-06-18 DIAGNOSIS — J432 Centrilobular emphysema: Principal | ICD-10-CM

## 2024-06-18 DIAGNOSIS — Z79899 Other long term (current) drug therapy: Principal | ICD-10-CM

## 2024-06-18 DIAGNOSIS — R059 Cough, unspecified type: Principal | ICD-10-CM

## 2024-06-18 DIAGNOSIS — Z23 Encounter for immunization: Principal | ICD-10-CM

## 2024-06-18 DIAGNOSIS — Z113 Encounter for screening for infections with a predominantly sexual mode of transmission: Principal | ICD-10-CM

## 2024-06-18 LAB — CBC W/ AUTO DIFF
BASOPHILS ABSOLUTE COUNT: 0 10*9/L (ref 0.0–0.1)
BASOPHILS RELATIVE PERCENT: 0.7 %
EOSINOPHILS ABSOLUTE COUNT: 0 10*9/L (ref 0.0–0.5)
EOSINOPHILS RELATIVE PERCENT: 0.7 %
HEMATOCRIT: 43.2 % (ref 34.0–44.0)
HEMOGLOBIN: 14.8 g/dL (ref 11.3–14.9)
LYMPHOCYTES ABSOLUTE COUNT: 2.5 10*9/L (ref 1.1–3.6)
LYMPHOCYTES RELATIVE PERCENT: 39.7 %
MEAN CORPUSCULAR HEMOGLOBIN CONC: 34.3 g/dL (ref 32.0–36.0)
MEAN CORPUSCULAR HEMOGLOBIN: 31.8 pg (ref 25.9–32.4)
MEAN CORPUSCULAR VOLUME: 92.8 fL (ref 77.6–95.7)
MEAN PLATELET VOLUME: 9.8 fL (ref 6.8–10.7)
MONOCYTES ABSOLUTE COUNT: 0.4 10*9/L (ref 0.3–0.8)
MONOCYTES RELATIVE PERCENT: 6.3 %
NEUTROPHILS ABSOLUTE COUNT: 3.3 10*9/L (ref 1.8–7.8)
NEUTROPHILS RELATIVE PERCENT: 52.6 %
PLATELET COUNT: 221 10*9/L (ref 150–450)
RED BLOOD CELL COUNT: 4.66 10*12/L (ref 3.95–5.13)
RED CELL DISTRIBUTION WIDTH: 13.8 % (ref 12.2–15.2)
WBC ADJUSTED: 6.2 10*9/L (ref 3.6–11.2)

## 2024-06-18 LAB — LYMPH MARKER LIMITED,FLOW
ABSOLUTE CD3 CNT: 2050 {cells}/uL (ref 915–3400)
ABSOLUTE CD4 CNT: 1250 {cells}/uL (ref 510–2320)
ABSOLUTE CD8 CNT: 800 {cells}/uL (ref 180–1520)
CD3% (T CELLS): 82 % (ref 61–86)
CD4% (T HELPER): 50 % (ref 34–58)
CD4:CD8 RATIO: 1.6 (ref 0.9–4.8)
CD8% T SUPPRESR: 32 % (ref 12–38)

## 2024-06-18 LAB — BASIC METABOLIC PANEL
ANION GAP: 7 mmol/L (ref 5–14)
BLOOD UREA NITROGEN: 14 mg/dL (ref 9–23)
BUN / CREAT RATIO: 17
CALCIUM: 9.8 mg/dL (ref 8.7–10.4)
CHLORIDE: 106 mmol/L (ref 98–107)
CO2: 26.9 mmol/L (ref 20.0–31.0)
CREATININE: 0.82 mg/dL (ref 0.55–1.02)
EGFR CKD-EPI (2021) FEMALE: 83 mL/min/1.73m2 (ref >=60)
GLUCOSE RANDOM: 135 mg/dL (ref 70–179)
POTASSIUM: 4 mmol/L (ref 3.4–4.8)
SODIUM: 140 mmol/L (ref 135–145)

## 2024-06-18 LAB — BILIRUBIN, TOTAL: BILIRUBIN TOTAL: 0.3 mg/dL (ref 0.3–1.2)

## 2024-06-18 LAB — HIV RNA, QUANTITATIVE, PCR: HIV RNA QNT RSLT: NOT DETECTED

## 2024-06-18 LAB — AST: AST (SGOT): 23 U/L (ref <=34)

## 2024-06-18 LAB — ALT: ALT (SGPT): 28 U/L (ref 10–49)

## 2024-06-18 LAB — SYPHILIS SCREEN: SYPHILIS RPR SCREEN: NONREACTIVE

## 2024-06-18 MED ORDER — BUDESONIDE-FORMOTEROL HFA 80 MCG-4.5 MCG/ACTUATION AEROSOL INHALER
Freq: Two times a day (BID) | RESPIRATORY_TRACT | 5 refills | 21.00000 days | Status: CP
Start: 2024-06-18 — End: 2025-06-18

## 2024-06-18 MED ORDER — COMIRNATY 2024-25 (12Y UP)(PF) 30 MCG/0.3 ML INTRAMUSCULAR SYRINGE
Freq: Once | INTRAMUSCULAR | 0 refills | 1.00000 days
Start: 2024-06-18 — End: 2024-06-19

## 2024-06-18 MED ORDER — BIKTARVY 50 MG-200 MG-25 MG TABLET
ORAL_TABLET | Freq: Every day | ORAL | 11 refills | 30.00000 days | Status: CP
Start: 2024-06-18 — End: 2025-06-18
  Filled 2024-07-09: qty 30, 30d supply, fill #0

## 2024-06-18 MED FILL — COMIRNATY 2024-25 (12Y UP)(PF) 30 MCG/0.3 ML INTRAMUSCULAR SYRINGE: INTRAMUSCULAR | 1 days supply | Qty: 0.3 | Fill #0
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
## 2024-06-18 NOTE — Unmapped (Signed)
 HIV-PULMONARY CLINIC VISIT:    Patient: Kristy Garcia([birth year])   Reason for visit: COPD    HISTORY OF PRESENT ILLNESS:     Patient is a 58 year old lady with a PMH as below including fibromyalgia, hyperthyroidism, MDD, HIV on ART, and COPD who presents today for follow up of her COPD and tobacco use.     HISTORY OF PRESENT ILLNESS (07/2023):   [name] reports that she believes she was diagnosed with COPD years ago, but was started on Symbicort  within the last year. She currently uses that - 2 puffs once a day. Spiriva  was added to her regimen about 3-4 months ago - 1 puff once a day. She notes that prior to starting these inhalers, she struggled with walking up a flight of stairs; however, she can now climb them without dyspnea. She also reports a significant decrease in her daily cough though she still has it occasionally; it is typically productive of clear sputum without blood. She does still have intermittent wheezing, which is more frequent at night. She reports infrequent use of her rescue albuterol , once every few days. She denies any chest pain, fevers, chills, or lower extremity swelling. She has required at least 3 course of oral prednisone  for her breathing in the past year, that she can recall. She denies any history of childhood asthma or respiratory illnesses. She does report that breathing issues are often triggered by very strong smells such as perfume, and very hot or very cold temperatures. She does have seasonal allergies in the spring and summer - she manages these with an oral antihistamine, tylenol  sinus, and flonase . She reports that she very rarely snores and has never been told that she gasps or chokes in her sleep. She generally wakes up well-rested but does need to take naps during the day. She denies any morning headaches. She does have uncontrolled acid reflux 3-4 times a week and was previously on a PPI but has not had it recently due to cost.     INTERVAL HISTORY (11/2023):  [name] reports that she is overall doing well today. She reports that her dyspnea on exertion is stable from her last visit and her mMRC score today is 2. She does have a dry cough that has not increased in frequency. She denies chest pain, chest tightness, fevers, chills or lower extremity swelling. At present, she is using her Symbicort  inhaler once a day, her albuterol  inhaler once a day and her Spiriva  inhaler as needed, typically 1-2 times a week. She reports that she has had R sided sinus pressure with headache (has been seen in IM clinic for evaluation) for the past three weeks with occasional nasal drainage, despite using her Flonase  daily. She has not had any respiratory infections or required any courses of prednisone  since her last visit. She has continued to cut down on smoking and is currently smoking 1 pack per week.     INTERVAL HISTORY (05/2024):  [name] reports that she since her last visit, she has continued to use her Symbicort  inhaler once a day and continues to require albuterol  2-3 times a day. She has not had Spiriva  since January. She reports that when she is exposed to heat and dust, she develops wheezing and chest tightness. Her mMRC is 2 today. She reports that her nasal drainage and congestion is much improved with using claritin  daily and flonase  BID. She reports some central chest pain after a fall she had two weeks ago which  has been improving steadily. She denies fevers, chills, or sputum production. She is continuing to use Chantix  and feels that her cigarette cravings are well controlled. She has cut down to 5 cpd. She feels that the cigarette with coffee in the morning will be the hardest to quit but is also struggling with cigarettes after meals.     Medication Adherence: Currently using incorrectly  Inhaler Technique: Using inhaler properly.    Pulmonary medications: Symbicort , albuterol   Exacerbation history: 02/2024    HIV status:  Date: CD4 Viral load 03/2021 1248 ND   07/2022 1643 ND   06/2023 1355 <20     Current regimen: Biktarvy     REVIEW OF SYSTEMS: All systems were reviewed and found to be negative except as above in the HPI.    PAST MEDICAL HISTORY:     MEDICAL/SURGICAL HISTORY:  Past Medical History:   Diagnosis Date    Asthma (HHS-HCC)     Bronchitis     Chest pain 01/23/2018    Dental caries     Depression     Depressive disorder 09/27/2013    R/o substance induced mood disorder    Fibromyalgia     HIV (human immunodeficiency virus infection)        HLD (hyperlipidemia)     Hypertension     Hypothyroidism     Palpitations 01/23/2018    Recurrent Candidal vulvovaginitis 03/11/2014    Reflux     Subacute frontal sinusitis 12/19/2023    Sudden onset of severe headache 12/02/2023     Past Surgical History:   Procedure Laterality Date    DILATION AND CURETTAGE OF UTERUS      EYE SURGERY Left     PR COLONOSCOPY FLX DX W/COLLJ SPEC WHEN PFRMD N/A 12/31/2016    Procedure: COLONOSCOPY, FLEXIBLE, PROXIMAL TO SPLENIC FLEXURE; DIAGNOSTIC, W/WO COLLECTION SPECIMEN BY BRUSH OR WASH;  Surgeon: Dallas Jama Das, MD;  Location: [facility];  Service: Gastroenterology    PR REPAIR ING HERNIA,5+Y/O,STRANG Left 04/17/2019    Procedure: PRIORITY REPAIR INITIAL INGUINAL HERNIA, AGE 87 YEARS OR OLDER; INCARCERATED OR STRANGULATED;  Surgeon: Bentley Shams, MD;  Location[facility] OR [facility];  Service: General Surgery       SOCIAL AND FAMILY HISTORY:   Social history: She is currently living with a friend temporarily and is applying for TCL housing. There are no pets in the home. 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 She started smoking at age 60 and at most, smoked 1 pack per day (42 pack-years). She has been using Chantix  intermittently over the past 6 months and with this, has been able to cut down to 7-8 cpd. She reports no cravings with the Chantix . She reports prior inhaled cocaine use, last 10-20 years ago. She is currently smoking 5 cpd.     Family History: Mother, brother and son all have asthma. Colon cancer runs on both sides of the family.     MEDICATIONS AND ALLERGIES:     CURRENT MEDICATIONS:  Outpatient Encounter Medications as of 06/18/2024   Medication Sig Dispense Refill    acetaminophen  (TYLENOL  8 HOUR) 650 MG CR tablet Take 2 tablets (1,300 mg total) by mouth in the morning.  albuterol  HFA 90 mcg/actuation inhaler Inhale 2 puffs every four (4) hours as needed for wheezing. 18 g 11    amlodipine  (NORVASC ) 10 MG tablet Take 1 tablet (10 mg total) by mouth daily. 90 tablet 3    budesonide -formoterol  (SYMBICORT ) 80-4.5 mcg/actuation inhaler Inhale 2 puffs two (2) times a day. 6.9 g 5    celecoxib  (CELEBREX ) 100 MG capsule Take 2 capsules (200 mg total) by mouth two (2) times a day. 360 capsule 1    diclofenac  sodium (VOLTAREN ) 1 % gel Apply 2 grams topically four (4) times a day. 450 g 5    DULoxetine  (CYMBALTA ) 60 MG capsule Take 1 capsule every day by oral route at bedtime 90 capsule 3    fluticasone  propionate (FLONASE ) 50 mcg/actuation nasal spray Instill 1 spray into each nostril daily. 16 g 5    lidocaine  (LIDODERM ) 5 % patch Apply 1 patch to affected area for 12 hours only each day (then remove patch) 180 patch 1    loratadine  (CLARITIN ) 10 mg tablet Take 1 tablet (10 mg total) by mouth daily. 90 tablet 0    naloxone  (NARCAN ) 4 mg nasal spray One spray in either nostril once for known/suspected opioid overdose. May repeat every 2-3 minutes in alternating nostril til EMS arrives 2 each 0    nicotine  polacrilex (NICORETTE) 2 mg gum Apply 1 each (2 mg total) to cheek every hour as needed for smoking cessation. (Patient not taking: Reported on 05/02/2024) 110 each 3    olmesartan  (BENICAR ) 20 MG tablet Take 1 tablet (20 mg total) by mouth daily. 90 tablet 3    omeprazole  (PRILOSEC) 20 MG capsule Take 1 capsule (20 mg total) by mouth daily. 90 capsule 3    pravastatin  (PRAVACHOL ) 40 MG tablet Take 1 tablet (40 mg total) by mouth daily. 90 tablet 3    pregabalin  (LYRICA ) 150 MG capsule Take 1 capsule (150 mg total) by mouth two (2) times a day. 180 capsule 1    propylthiouracil  (PTU) 50 mg tablet Take 1 tablet (50 mg total) by mouth two (2) times a day. 60 tablet 5    QUEtiapine  (SEROQUEL ) 25 MG tablet Take 1 full tablet by mouth nightly. 90 tablet 3    tizanidine  (ZANAFLEX ) 2 MG tablet Take 1 tablet (2 mg total) by mouth every eight (8) hours as needed. 30 tablet 0    traZODone  (DESYREL ) 50 MG tablet Take 1 tablet (50 mg total) by mouth nightly. 90 tablet 3    varenicline  tartrate (CHANTIX  CONTINUING MONTH BOX) 1 mg tablet Take 1 tablet (1 mg total) by mouth two (2) times a day. 60 tablet 2    [DISCONTINUED] bictegrav-emtricit-tenofov ala (BIKTARVY ) 50-200-25 mg tablet Take 1 tablet by mouth daily. 30 tablet 11     No facility-administered encounter medications on file as of 06/18/2024.       ALLERGIES:  Allergies as of 06/18/2024 - Reviewed 05/02/2024   Allergen Reaction Noted    Codeine Hives, Nausea And Vomiting, and Rash 05/17/2013    Ibuprofen Other (See Comments) 04/17/2019    Adhesive tape-silicones  06/04/2021    Adhesive Itching 12/16/2017       PHYSICAL EXAM:     Vitals:    06/18/24 0939   BP: 115/61   BP Position: Sitting   BP Cuff Size: Medium   Pulse: 61   Temp: 37 ??C (98.6 ??F)   TempSrc: Tympanic   SpO2: 94%   Weight: 62.7 kg (138 lb 3.3 oz)  Body mass index is 23.72 kg/m??.    GENERAL: well nourished, cooperative, no acute distress  EYES: anicteric, noninjected, EOMi  HEENT: Neck supple, trachea midline, moist mucus membranes  CV: Regular rate, normal rhythm, no murmurs/rubs/gallops  PULM: Clear to auscultation bilaterally. No dullness to percussion. Normal excursion. Normal work of breathing.   ABD: Soft, nontender, nondistended.   EXTREMITIES: No digital clubbing. No edema.  SKIN: No rashes, lesions, or skin breakdown. Warm and well perfused.  NEURO: No focal neurologic deficits. Moves all extremities and follows commands.   PSYCH: Well groomed, appropriate mood and affect, good eye contact.       ANCILLARY DATA:     Date: FVC (% Pred) FEV1 (% Pred)  Pre-BD FEV1 (%Pred)  Post- BD FEV1/FVC DLCO (% Pred) VC (% Pred) TLC (% Pred) RV (%Pred)   January 2025 2.72 (91%) 3.20 (88%) 2.16 (91%) 77 (68) 73% (-2.00)        CT LCS (07/2022): Mild centrilobular emphysema. Scattered right upper lobe granulomas. 44 right upper lobe nodule.     CT LCS (12/2023): Trace centrilobular emphysema. Calcified RUL granuloma. No other suspicious nodules. Lung RADS 2.    Echocardiogram (06/2023): LV normal size and wall thickness with normal systolic function. RV normal in size with normal systolic function.     All imaging and labs were reviewed personally.    ASSESSMENT AND PLAN:     Ms. Hoxworth is a 58 year old lady with a PMH including fibromyalgia, hyperthyroidism, MDD, HIV on ART, and COPD who presents today for follow up of her COPD-asthma overlap and tobacco use.     Although her PFTs today technically do not meet criteria for obstruction by ATS/ERS definitions, she does have some concavity of her expiratory loop that is suggestive of obstruction and she does have mild emphysema on her CT, which supports treating her as such. Additionally she does report typical triggers with acute symptoms consistent clinically with asthma. 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 She is currently using an ICS/LABA once daily and as needed. She does still have symptoms requiring acute relief three times a week on this and an mMRC score of 2. I would like to focus on having her use the ICS/LABA as true SMART therapy - twice daily and as needed. If her symptoms persist despite this, we can consider adding the Spiriva  back on versus trying again to get Trelegy or Breztri covered for triple therapy. I will ask our HIV-COPD care partner to reach out to her to address any barriers to inhaler adherence in 2 weeks.     She will continue her oral antihistamine and daily intranasal steroid for her chronic sinus disease.     I congratulated her on continuing to reduce her smoking as she continues to cut down on her tobacco use. She will continue on the Chantix . She does not like the nicotine  gum or lozenge so we will try using ginseng candy to replace cigarettes as an after meal habit.     She is presently engaged in lung cancer screening and will be due again February 2026. I will place the order for this today.     She is up to date on her influenza, pneumonia, and TdaP vaccines.     I will have her follow up in 3 months or sooner as needed.     I personally spent 44 minutes face-to-face and non-face-to-face in the care of this patient, which includes all  pre, intra, and post visit time on the date of service.  All documented time was specific to the E/M visit and does not include any procedures that may have been performed.

## 2024-06-18 NOTE — Unmapped (Signed)
 COVID Education:  Make sure you perform good hand washing (lasting 20 seconds), continue to social distance and limit close personal contact (which may include new sexual partners or having multiple partners during this period).  Try to isolate at home but please find ways to keep in touch with those close to you, such as meeting up with them electronically or socially distanced, and the ability to go outdoors alone or separated from others  If you become ill with fever, respiratory illness, sudden loss of taste and smell, stomach issues, diarrhea, nausea, vomiting - contact clinic for further instructions.  You should go to the emergency department if you develop systems such as shortness of breath, confusion, lightheadedness when standing, high fever.   Here is some information about HIV and CoVid vaccines: [email or web address]  If you're interested in receiving the CoVid vaccine when you're eligible, here are some resources for you to check and make an appointment:  Your local health department   [email or web address] through [facility]  [email or web address]  [facility] (if you are an established patient with them)  [email or web address]    URGENT CARE  Please call ahead to speak with the nursing staff if you are in need of an urgent appointment.       MEDICATIONS  For refills please contact your pharmacy and ask them to electronically send or fax the request to the clinic.   Please bring all medications in original bottles to every appointment.    [facility] (formerly [facility]) or [employer] Eligibility (required even if you do not receive medication through [facility])  Please remember to renew your Juanell Fairly eligibility during renewal periods which occur twice a year: January-March and July-September.     The following are needed for each renewal:   - [facility] Identification (if you don't have one, then a bill with your name and address in [place])   - proof of income (award letter, W-2, or last three check stubs)   If you are unable to come in for renewal, let us know if we can mail, fax or e-mail paperwork to you.   [facility] Contact: [phone].     Lab info:  Your most recent CD4 T-cell counts and viral loads are below. Here are a few things to keep in mind when looking at your numbers:  Our goal is to get your virus to be undetectable and keep it undetectable. If the virus is undetectable you are much more likely to stay healthy.  We consider your viral load to be undetectable if it says <40 or if it says Not detected.  For most people, we're checking CD4 counts every other visit (once or twice a year, or sometimes even less).  It's normal for your CD4 count to be different from visit to visit.   You can help by taking your medications at about the same time, every single day. If you're having trouble with taking your medications, it's important to let us know.    Lab Results   Component Value Date    ACD4 1,378 06/03/2023    CD4 53 06/03/2023    HIVCP <20 07/22/2023    HIVRS Not Detected 06/03/2023        Please note that your laboratory and other results may be visible to you in real time, possibly before they reach your provider. Please allow 48 hours for clinical interpretation of these results. Importantly, even if a result is flagged as abnormal, it may not be one that impacts your  health.    It was nice to have a visit with you today!  Follow-up information:        Provider today:  Varney Daily, FNP-BC      ID CLINIC address:   [facility]  [address]  [place], [place] [address]    Contact information:    The ID clinic phone number is [phone]   The ID clinic fax number is [phone]  For urgent issues on nights and weekends: Call the ID Physician on-call through the [facility] Operator at [phone].    Please sign up for My [facility] Chart - This is a great way to review your labs and track your appointments    Please try to arrive 30 minutes BEFORE your scheduled appointment time!  This will give you time to fill out any front desk paperwork needed for your visit, and allow you to be seen as close to your scheduled appointment time as possible.

## 2024-06-18 NOTE — Unmapped (Signed)
 Things that we talked about today:  I'd like you to increase your Symbicort  to 2 puffs TWICE a day.  You can continue to use your albuterol  as needed.  Our Care Partner will be contacting you in approximately 2 weeks from a [phone] phone number to discuss your inhalers and symptoms.   Try using the ginseng candy after your have a meal to help delay that post-meal cigarette that you're used to. Cutting down by one cigarette every two weeks.     Please take your medications as prescribed.  Thank you for allowing me to be a part of your care.  Please call the clinic with any questions.    *Being smoke-free makes a difference!  - Increased energy  - Decreased chance of cancer and heart disease  - Better healing from illness and injury  - Saves money  - Saves time    *By using a combination of medication and counseling, we can help you quit*  - Medications help you manage withdrawal symptoms while you quit  - Medications can help decrease urges and cravings to smoke    *It might take many attempts to quit, but every time you try gets you closer to quitting!*    Marcel Saxon, MD  Pulmonary and Critical Care Medicine  [address]  [id number]  [place], [place] [address]    Thank you for your visit to the [facility].  You may receive a survey from [facility] regarding your visit today, and we are eager to use this feedback to improve your experience.  Thank you for taking the time to fill it out.    Between appointments, you can reach us  at these numbers:    For appointments or the ID Nurse: [phone]  Fax: [phone]    For urgent issues after hours:  Hospital Operator: [phone], ask for Pulmonary Fellow on call

## 2024-07-01 ENCOUNTER — Emergency Department
Admit: 2024-07-01 | Discharge: 2024-07-01 | Disposition: A | Discharge status: Home / Self Care | Payer: PRIVATE HEALTH INSURANCE

## 2024-07-01 DIAGNOSIS — M549 Dorsalgia, unspecified: Principal | ICD-10-CM

## 2024-07-01 DIAGNOSIS — R109 Unspecified abdominal pain: Principal | ICD-10-CM

## 2024-07-01 LAB — COMPREHENSIVE METABOLIC PANEL
ALBUMIN: 3.9 g/dL (ref 3.4–5.0)
ALKALINE PHOSPHATASE: 69 U/L (ref 46–116)
ALT (SGPT): 21 U/L (ref 10–49)
ANION GAP: 13 mmol/L (ref 5–14)
AST (SGOT): 20 U/L (ref <=34)
BILIRUBIN TOTAL: 0.2 mg/dL — ABNORMAL LOW (ref 0.3–1.2)
BLOOD UREA NITROGEN: 10 mg/dL (ref 9–23)
BUN / CREAT RATIO: 13
CALCIUM: 9.7 mg/dL (ref 8.7–10.4)
CHLORIDE: 104 mmol/L (ref 98–107)
CO2: 24.8 mmol/L (ref 20.0–31.0)
CREATININE: 0.75 mg/dL (ref 0.55–1.02)
EGFR CKD-EPI (2021) FEMALE: >90 mL/min/1.73m2 (ref >=60)
GLUCOSE RANDOM: 244 mg/dL — ABNORMAL HIGH (ref 70–179)
POTASSIUM: 3.8 mmol/L (ref 3.4–4.8)
PROTEIN TOTAL: 7 g/dL (ref 5.7–8.2)
SODIUM: 142 mmol/L (ref 135–145)

## 2024-07-01 LAB — CBC W/ AUTO DIFF
BASOPHILS ABSOLUTE COUNT: 0 10*9/L (ref 0.0–0.1)
BASOPHILS RELATIVE PERCENT: 0.6 %
EOSINOPHILS ABSOLUTE COUNT: 0.1 10*9/L (ref 0.0–0.5)
EOSINOPHILS RELATIVE PERCENT: 1.3 %
HEMATOCRIT: 40.2 % (ref 34.0–44.0)
HEMOGLOBIN: 13.9 g/dL (ref 11.3–14.9)
LYMPHOCYTES ABSOLUTE COUNT: 3.2 10*9/L (ref 1.1–3.6)
LYMPHOCYTES RELATIVE PERCENT: 48.8 %
MEAN CORPUSCULAR HEMOGLOBIN CONC: 34.5 g/dL (ref 32.0–36.0)
MEAN CORPUSCULAR HEMOGLOBIN: 32.6 pg — ABNORMAL HIGH (ref 25.9–32.4)
MEAN CORPUSCULAR VOLUME: 94.4 fL (ref 77.6–95.7)
MEAN PLATELET VOLUME: 9.9 fL (ref 6.8–10.7)
MONOCYTES ABSOLUTE COUNT: 0.4 10*9/L (ref 0.3–0.8)
MONOCYTES RELATIVE PERCENT: 5.4 %
NEUTROPHILS ABSOLUTE COUNT: 2.9 10*9/L (ref 1.8–7.8)
NEUTROPHILS RELATIVE PERCENT: 43.9 %
NUCLEATED RED BLOOD CELLS: 0 /100{WBCs} (ref <=4)
PLATELET COUNT: 206 10*9/L (ref 150–450)
RED BLOOD CELL COUNT: 4.25 10*12/L (ref 3.95–5.13)
RED CELL DISTRIBUTION WIDTH: 14.2 % (ref 12.2–15.2)
WBC ADJUSTED: 6.5 10*9/L (ref 3.6–11.2)

## 2024-07-01 LAB — URINALYSIS WITH MICROSCOPY WITH CULTURE REFLEX PERFORMABLE
BILIRUBIN UA: NEGATIVE
BLOOD UA: NEGATIVE
GLUCOSE UA: >1000 — AB
KETONES UA: NEGATIVE
LEUKOCYTE ESTERASE UA: NEGATIVE
NITRITE UA: NEGATIVE
PH UA: 5.5 (ref 5.0–9.0)
PROTEIN UA: NEGATIVE
RBC UA: <1 /HPF (ref <=4)
SPECIFIC GRAVITY UA: 1.014 (ref 1.003–1.030)
SQUAMOUS EPITHELIAL: 3 /HPF (ref 0–5)
UROBILINOGEN UA: <2
WBC UA: <1 /HPF (ref 0–5)

## 2024-07-01 LAB — HIGH SENSITIVITY TROPONIN I - SINGLE: HIGH SENSITIVITY TROPONIN I: <3 ng/L (ref <=34)

## 2024-07-01 LAB — LIPASE: LIPASE: 55 U/L — ABNORMAL HIGH (ref 12–53)

## 2024-07-01 LAB — PREGNANCY, URINE: PREGNANCY TEST URINE: NEGATIVE

## 2024-07-01 MED ADMIN — iohexol (OMNIPAQUE) 350 mg iodine/mL solution 100 mL: 100 mL | INTRAVENOUS | @ 09:00:00 | Stop: 2024-07-01

## 2024-07-01 MED ADMIN — lidocaine (ASPERCREME) 4 % 1 patch: 1 | TRANSDERMAL | @ 10:00:00 | Stop: 2024-07-01

## 2024-07-01 MED ADMIN — acetaminophen (TYLENOL) tablet 1,000 mg: 1000 mg | ORAL | @ 10:00:00 | Stop: 2024-07-01

## 2024-07-01 NOTE — Unmapped (Signed)
 [facility]  Emergency Department Provider Note    ED Clinical Impression     Final diagnoses:   Back pain, unspecified back location, unspecified back pain laterality, unspecified chronicity (Primary)   Abdominal pain, unspecified abdominal location       HPI, ED Course, Assessment and Plan     Initial Clinical Impression:    July 01, 2024 2:57 AM     History of Present Illness  Kristy Garcia is a 58 year old female with emphysema, alcohol use disorder, HIV, hypertension, hypothyroidism who presents with left back pain as well as left upper quadrant pain.    She has been experiencing abdominal pain since yesterday morning, which began while she was asleep. The pain is diffuse across her abdomen and intensifies when lying on her back or with movement. The pain was notably exacerbated by the bumps during the ambulance ride to the hospital.    No nausea, vomiting, weakness, numbness, tingling, fever, or burning sensation during urination. No blood in urine. She experiences a little shortness of breath, which she attributes to her usual condition due to emphysema.    Her current medications include trazodone  and tizanidine , which she took before coming to the hospital.        BP 101/58  - Pulse 95  - Resp 18  - LMP 03/03/2016 (Approximate)  - SpO2 96%     Medical Decision Making    58 year old female with past medical history as above presents for left back pain rating to her left upper quadrant.  On exam patient is afebrile, hemodynamically stable and resting comfortably.  She is sleepy on exam however this is secondary to her trazodone  and tizanidine  just prior to arrival.  On exam patient has tenderness to palpation of the left thoracic paraspinal muscles with CVA tenderness.  No significant left upper quadrant tenderness to palpation.  No skin changes.  Differential includes musculoskeletal pain as this is localized, worsened by movement and palpation.  Also considered UTI, pyelonephritis, nephrolithiasis as well as other intra-abdominal pathologies.  Plan for pain control, laboratory results, CT abdomen pelvis.  I did consider cardiac pathology such as ACS, PE, dissection however not, consistent with exam, history as well as nonischemic EKG and reassuring troponin.      Further ED updates and updates to plan as per ED Course below:    ED Course as of 07/01/24 0637   [facility] Jul 01, 2024   0458 CBC without leukocytosis or anemia.  CMP with elevated glucose though no anion gap, CO2 is within normal limits.  Low concern for DKA or HHS.  UA without evidence of infection.   0459 Lipase is not consistent with pancreatitis.  Initial troponin is reassuring as EKG is also nonischemic.  Heart score of 2   0554 CT without acute intra-abdominal pathology.   9362 Patient ambulated without difficulty.  Will discharge home at this time.  Return precautions given.         External Records Reviewed:     Independent Interpretation of Studies: I have independently interpreted the following studies:  EKG without ST elevations or depressions, nonischemic.                    Social Drivers of Health with Concerns     Food Insecurity: Food Insecurity Present (08/18/2023)    Hunger Vital Sign     Worried About Running Out of Food in the Last Year: Never true     Ran Out  of Food in the Last Year: Sometimes true   Tobacco Use: High Risk (05/02/2024)    Patient History     Smoking Tobacco Use: Every Day     Smokeless Tobacco Use: Former     Passive Exposure: Current   Transportation Needs: Unmet Transportation Needs (08/18/2023)    PRAPARE - Therapist, art (Medical): Yes     Lack of Transportation (Non-Medical): Yes   Housing: High Risk (08/18/2023)    Housing     Within the past 12 months, have you ever stayed: outside, in a car, in a tent, in an overnight shelter, or temporarily in someone else's home (i.e. couch-surfing)?: No     Are you worried about losing your housing?: Yes   Physical Activity: Insufficiently Active (08/18/2023)    Exercise Vital Sign     Days of Exercise per Week: 1 day     Minutes of Exercise per Session: 30 min   Social Connections: Socially Isolated (08/18/2023)    Social Connection and Isolation Panel     Frequency of Communication with Friends and Family: More than three times a week     Frequency of Social Gatherings with Friends and Family: More than three times a week     Attends Religious Services: Never     Database administrator or Organizations: No     Attends Banker Meetings: Never     Marital Status: Never married   Physicist, medical Strain: High Risk (08/18/2023)    Overall Financial Resource Strain (CARDIA)     Difficulty of Paying Living Expenses: Very hard   Health Literacy: Medium Risk (08/18/2023)    Health Literacy     : Rarely     _____________________________________________________________________    The case was discussed with the attending physician who is in agreement with the above assessment and plan    Past History     PAST MEDICAL HISTORY/PAST SURGICAL HISTORY:   Past Medical History[1]    Past Surgical History[2]    MEDICATIONS:     Current Facility-Administered Medications:     lidocaine  (ASPERCREME) 4 % 1 patch, 1 patch, Transdermal, Once, Vikki Carrier, MD, 1 patch at 07/01/24 9387    Current Outpatient Medications:     acetaminophen  (TYLENOL  8 HOUR) 650 MG CR tablet, Take 2 tablets (1,300 mg total) by mouth in the morning., Disp: , Rfl:     albuterol  HFA 90 mcg/actuation inhaler, Inhale 2 puffs every four (4) hours as needed for wheezing., Disp: 18 g, Rfl: 11    amlodipine  (NORVASC ) 10 MG tablet, Take 1 tablet (10 mg total) by mouth daily., Disp: 90 tablet, Rfl: 3    bictegrav-emtricit-tenofov ala (BIKTARVY ) 50-200-25 mg tablet, Take 1 tablet by mouth daily., Disp: 30 tablet, Rfl: 11    budesonide -formoterol  (SYMBICORT ) 80-4.5 mcg/actuation inhaler, Inhale 2 puffs two (2) times a day., Disp: 6.9 g, Rfl: 5    celecoxib  (CELEBREX ) 100 MG capsule, Take 2 capsules (200 mg total) by mouth two (2) times a day., Disp: 360 capsule, Rfl: 1    diclofenac  sodium (VOLTAREN ) 1 % gel, Apply 2 grams topically four (4) times a day., Disp: 450 g, Rfl: 5    DULoxetine  (CYMBALTA ) 60 MG capsule, Take 1 capsule every day by oral route at bedtime, Disp: 90 capsule, Rfl: 3    fluticasone  propionate (FLONASE ) 50 mcg/actuation nasal spray, Instill 1 spray into each nostril daily., Disp: 16 g, Rfl: 5    gabapentin  (  NEURONTIN ) 300 MG capsule, Take 2 capsules (600 mg total) by mouth at bedtime., Disp: , Rfl:     lidocaine  (LIDODERM ) 5 % patch, Apply 1 patch to affected area for 12 hours only each day (then remove patch), Disp: 180 patch, Rfl: 1    loratadine  (CLARITIN ) 10 mg tablet, Take 1 tablet (10 mg total) by mouth daily., Disp: 90 tablet, Rfl: 0    naloxone  (NARCAN ) 4 mg nasal spray, One spray in either nostril once for known/suspected opioid overdose. May repeat every 2-3 minutes in alternating nostril til EMS arrives, Disp: 2 each, Rfl: 0    nicotine  polacrilex (NICORETTE) 2 mg gum, Apply 1 each (2 mg total) to cheek every hour as needed for smoking cessation. (Patient not taking: Reported on 05/02/2024), Disp: 110 each, Rfl: 3    olmesartan  (BENICAR ) 20 MG tablet, Take 1 tablet (20 mg total) by mouth daily., Disp: 90 tablet, Rfl: 3    omeprazole  (PRILOSEC) 20 MG capsule, Take 1 capsule (20 mg total) by mouth daily., Disp: 90 capsule, Rfl: 3    pravastatin  (PRAVACHOL ) 40 MG tablet, Take 1 tablet (40 mg total) by mouth daily., Disp: 90 tablet, Rfl: 3    pregabalin  (LYRICA ) 150 MG capsule, Take 1 capsule (150 mg total) by mouth two (2) times a day., Disp: 180 capsule, Rfl: 1    propylthiouracil  (PTU) 50 mg tablet, Take 1 tablet (50 mg total) by mouth two (2) times a day., Disp: 60 tablet, Rfl: 5    QUEtiapine  (SEROQUEL ) 25 MG tablet, Take 1 full tablet by mouth nightly., Disp: 90 tablet, Rfl: 3    tizanidine  (ZANAFLEX ) 2 MG tablet, Take 1 tablet (2 mg total) by mouth every eight (8) hours as needed., Disp: 30 tablet, Rfl: 0    traZODone  (DESYREL ) 50 MG tablet, Take 1 tablet (50 mg total) by mouth nightly., Disp: 90 tablet, Rfl: 3    varenicline  tartrate (CHANTIX  CONTINUING MONTH BOX) 1 mg tablet, Take 1 tablet (1 mg total) by mouth two (2) times a day., Disp: 60 tablet, Rfl: 2    ALLERGIES:   Codeine, Ibuprofen, Adhesive tape-silicones, and Adhesive    SOCIAL HISTORY:   Social History     Tobacco Use    Smoking status: Every Day     Current packs/day: 1.00     Average packs/day: 1 pack/day for 39.0 years (39.0 ttl pk-yrs)     Types: Cigarettes     Passive exposure: Current    Smokeless tobacco: Former   Substance Use Topics    Alcohol use: Yes     Alcohol/week: 2.0 standard drinks of alcohol     Types: 2 Cans of beer per week     Comment: occ       FAMILY HISTORY:  Family History[3]     Review of Systems     A review of systems was performed and relevant portions were as noted above in HPI     Physical Exam     VITAL SIGNS:    BP 101/58  - Pulse 95  - Resp 18  - LMP 03/03/2016 (Approximate)  - SpO2 96%     Physical Exam  GENERAL: Alert, cooperative, well developed, no acute distress  HEENT: Normocephalic, normal oropharynx, moist mucous membranes  CHEST: Clear to auscultation bilaterally, no wheezes, rhonchi, or crackles  CARDIOVASCULAR: Normal heart rate and rhythm, S1 and S2 normal without murmurs  ABDOMEN: Soft, tenderness on left side, non-distended, without organomegaly, normal bowel sounds  EXTREMITIES:  No cyanosis or edema  NEUROLOGICAL: Cranial nerves grossly intact, moves all extremities without gross motor or sensory deficit        Constitutional:   Alert and oriented.   Head:   Normocephalic and atraumatic  Eyes:   Conjunctivae are normal, EOMI, PERRL  ENT:   No notable congestion, Mucous membranes moist, External ears normal, no notable stridor  Cardiovascular:   Rate as vitals above. Appears warm and well perfused  Respiratory:   Normal respiratory effort. Breath sounds are normal.  Gastrointestinal:   Soft, non-distended, and mild tenderness to palpation of the left lower quadrant without rebound or guarding.  Positive left-sided CVA tenderness..   Genitourinary:   Deferred  Musculoskeletal:    Normal range of motion in all extremities. No tenderness or edema noted in B/L lower extremities.  Tenderness to palpation of the left thoracic paraspinal muscles  Neurologic:   No gross focal neurologic deficits beyond baseline are appreciated.  Skin:   Skin is warm, dry and intact.       Radiology     CT Abdomen Pelvis W IV Contrast Only   Final Result   No acute inflammatory process identified in the abdomen or pelvis.             Labs     Labs Reviewed   COMPREHENSIVE METABOLIC PANEL - Abnormal; Notable for the following components:       Result Value    Glucose 244 (*)     Total Bilirubin 0.2 (*)     All other components within normal limits   LIPASE - Abnormal; Notable for the following components:    Lipase 55 (*)     All other components within normal limits   CBC W/ AUTO DIFF - Abnormal; Notable for the following components:    MCH 32.6 (*)     All other components within normal limits   URINALYSIS WITH MICROSCOPY WITH CULTURE REFLEX PERFORMABLE - Abnormal; Notable for the following components:    Glucose, UA >1000 mg/dL (*)     Bacteria, UA Rare (*)     All other components within normal limits   PREGNANCY, URINE - Normal   HIGH SENSITIVITY TROPONIN I - SINGLE - Normal   CBC W/ DIFFERENTIAL    Narrative:     The following orders were created for panel order CBC w/ Differential.                  Procedure                               Abnormality         Status                                     ---------                               -----------         ------                                     CBC w/ [id number]]         Abnormal  Final result                                                 Please view results for these tests on the individual orders.   URINALYSIS WITH MICROSCOPY WITH CULTURE REFLEX    Narrative:     The following orders were created for panel order Urinalysis with Microscopy with Culture Reflex.                  Procedure                               Abnormality         Status                                     ---------                               -----------         ------                                     Urinalysis with Microsc.SABRASABRA[[id number]]  Abnormal            Final result                                                 Please view results for these tests on the individual orders.         Pertinent labs & imaging results that were available during my care of the patient were reviewed by me and considered in my medical decision making (see chart for details).    Please note- This chart has been created using [employer]. Chart creation errors have been sought, but may not always be located and such creation errors, especially pronoun confusion, do NOT reflect on the standard of medical care.         [1]   Past Medical History:  Diagnosis Date    Asthma     Bronchitis     Chest pain 01/23/2018    Dental caries     Depression     Depressive disorder 09/27/2013    R/o substance induced mood disorder    Fibromyalgia     HIV (human immunodeficiency virus infection)        HLD (hyperlipidemia)     Hypertension     Hypothyroidism     Palpitations 01/23/2018    Recurrent Candidal vulvovaginitis 03/11/2014    Reflux     Subacute frontal sinusitis 12/19/2023    Sudden onset of severe headache 12/02/2023   [2]   Past Surgical History:  Procedure Laterality Date    DILATION AND CURETTAGE OF UTERUS      EYE SURGERY Left     PR COLONOSCOPY FLX DX W/COLLJ SPEC WHEN PFRMD N/A 12/31/2016    Procedure: COLONOSCOPY, FLEXIBLE, PROXIMAL TO SPLENIC FLEXURE; DIAGNOSTIC, W/WO COLLECTION SPECIMEN BY BRUSH OR WASH;  Surgeon: Dallas Jama Das, MD;  Location: [facility];  Service: Gastroenterology    PR REPAIR ING HERNIA,5+Y/O,STRANG Left 04/17/2019    Procedure: PRIORITY REPAIR INITIAL INGUINAL HERNIA, AGE 89 YEARS OR OLDER; INCARCERATED OR STRANGULATED;  Surgeon: Bentley Shams, MD;  Location: [facility];  Service: General Surgery   [3]   Family History  Problem Relation Age of Onset    Depression Mother     Cataracts Mother     Macular degeneration Mother     Colon cancer Maternal Uncle     Colon cancer Maternal Uncle     Colon cancer Paternal Aunt     Asthma Paternal Grandmother     Asthma Paternal Apolinar Vikki Carrier, MD  Resident  07/01/24 [phone]

## 2024-07-01 NOTE — Unmapped (Signed)
Bed: 04  Expected date:   Expected time:   Means of arrival:   Comments:  EMS

## 2024-07-01 NOTE — Unmapped (Signed)
 Patient BIB [facility] with c/o left back and LUQ pain

## 2024-07-05 NOTE — Unmapped (Signed)
 HIV-COPD Telehealth Program      Spoke with Evon Bindu Docter on 07/05/24 to follow up on COPD symptoms.    MMRC Score: Which of these statements is the closest to how you feel when walking on level ground?  1: I get short of breath when hurrying on the level or walking up a slight hill      Current Inhaler Regimen: Symbicort  daily & prn albuterol       Barriers to adherence: none       Advice given to patient:Reinforced correct technique      Changes made to inhaler regimen:none      Time duration of contact:10 minutes      Interval to next planned contact (ASAP, 2 weeks, 4 weeks, 6-8 weeks):      Provider for next planned contact (RN/CMA or MD):      Medication education provided? No.      Additional notes:

## 2024-07-05 NOTE — Unmapped (Unsigned)
 Internal Medicine Video Visit    This visit is conducted via video conferencing.    Contact Information  Person Contacted: Patient  Contact Phone number: [phone] (home)   Is there someone else in the room? No.   Patient agreed to a video visit    Kristy Garcia is a 58 y.o. female  participating in a video visit.    Reason for visit:  Ongoing back pain    Subjective:  Pt has htn, hyperthyroid, and HIV.     Wearing pain patches and taking muscle relaxants    Not as bad as it was    Woke up on Friday morning, back was like that.   Feels different to her chronic pain, located in the back and goes around to the stomach.   Bending over makes it worse, or moving a certain way  Duloxetine , tylenol , celebrex , lyrica .   No numbness or tingling down legs.   No weakness on one side of her body, feels tight on one side. No fever.     Sees spine doctor in October.     I have reviewed the problem list, medications, and allergies and have updated/reconciled them if needed.    Objective:  Appears well on video, walking around outside her house           Assessment & Plan:  1. Chronic midline low back pain with bilateral sciatica             A/P:  58 y.o. female with past medical history of HIV, alcohol use disorder, hypertension, hypothyroidism, and emphysema     Left-Sided Lower Back Pain  Has chronic left hip and back pain that often has sciatic radiation per chart review. Follows with Spine and has had multiple imaging studies including MR-L 12/2022: Moderate facet arthropathy L5-S1.  No significant canal or foraminal stenosis. XR-C 12/2022: Mild retrolisthesis C5-C6.  No dynamic listhesis. MR-C/T 04/2023: Moderate canal stenosis C5-C6.  No abnormal signal of the cord. Recently seen in ED for left lower back pain radiating to the left flank that was worse than her chronic pain. Had a CT A/P which was normal. Pain has somewhat improved since ED visit but is still bothersome to patient. She has been wearing lidocaine  patches and taking Tizanidine  which she states help a little. She denies any red flag symptoms today. Unable to fully examine patient due to video visit. We discussed that given her pain is worse with movement she likely has an acute exacerbation of her chronic back pain. We discussed that physical therapy would be the best way to treat this - she notes that transportation can be an issue thus provided with a list of resources and referred to a PT close to her house.   - Continue lidocaine  patches  - Continue Tizanidine  prn, discussed limiting this to only when severe  - Follow up with [facility] in October  - Referral to Physical Therapy  - Provided with resources to help with transportation    No follow-ups on file.       Staffed with Dr. Kimel-Scott, discussed      The patient reports they are physically located in [place]  and is currently: at home. I conducted a audio/video visit. I spent  94m 58s on the video call with the patient. I spent an additional 15 minutes on pre- and post-visit activities on the date of service . is generally stable with her current medication regimen. She has not seen her psychiatry team  recently due to circumstances but plans to reschedule. She also reports a recent bereavement, as a friend passed away from cancer.      PTHomeBP   Lab Results   Component Value Date    ACD4 1,250 06/18/2024    CD4 50 06/18/2024    HIVCP <20 07/22/2023    HIVRS Not Detected 06/18/2024       __________________________________________________________    Problem List:  Problem List[1]    Medications:  Reviewed in EPIC    _________________________________________________________    Physical Exam:   Vital Signs:  There were no vitals filed for this visit.    Wt Readings from Last 3 Encounters:   06/18/24 62.7 kg (138 lb 3.3 oz)   06/18/24 73 kg (161 lb)   05/02/24 74.7 kg (164 lb 9.6 oz)     Physical Exam  GENITOURINARY: Cervix normal.  MUSCULOSKELETAL: Midline back tenderness.  Gait: normal   Pelvic: exam chaperoned by nurse, normal vagina and vulva, pelvic exam is limited due to obesity, normal cervix without lesions, polyps or tenderness, and pap smear done today   Records review  Last   Lab Results   Component Value Date    CREATININE 0.75 07/01/2024    CHOL 138 08/18/2023    HDL 40 08/18/2023    LDL 66 08/18/2023    NONHDL 98 08/18/2023    TRIG 837 (H) 08/18/2023    A1C 6.2 (H) 12/28/2023        The 10-year ASCVD risk score (Arnett DK, et al., 2019) is: 7.6%   Medication adherence and barriers to the treatment plan have been addressed. Opportunities to optimize healthy behaviors have been discussed. Patient / caregiver voiced understanding.    {TIP - HCC- RAFF Pilot- Clinical Documentation Specialist Recommendations-  No specialty comments available.   This text will self delete upon signing note:[id number]}     I personally spent 28 minutes face-to-face and non-face-to-face in the care of this patient, which includes all pre, intra, and post visit time on the date of service.         [1]   Patient Active Problem List  Diagnosis Human immunodeficiency virus (HIV) disease        Hyperthyroidism    Suicide and self-inflicted injury        Tobacco use disorder    Cocaine use disorder, mild, in sustained remission, abuse (CMS-HCC)    Alcohol use disorder, moderate, in sustained remission        HIV (human immunodeficiency virus infection)        Essential hypertension (RAF-HCC)    Hyperlipidemia    Osteopenia    Sciatica of right side    Cervical dysplasia    Joint pain in both hands    Chronic pain of left ankle    Arthralgia of multiple sites, bilateral    Moderate episode of recurrent major depressive disorder (CMS-HCC)    Encounter for therapeutic drug monitoring    Chronic midline low back pain with bilateral sciatica    Primary osteoarthritis of both hands    Chronic pain syndrome    Encounter for long-term (current) use of high-risk medication    Myalgia, multiple sites    Centrilobular emphysema        Blurred vision    Right sided temporal headache    Abnormal serum thyroid stimulating hormone (TSH) level    Asthma-COPD overlap syndrome

## 2024-07-06 DIAGNOSIS — M5441 Lumbago with sciatica, right side: Principal | ICD-10-CM

## 2024-07-06 DIAGNOSIS — G8929 Other chronic pain: Principal | ICD-10-CM

## 2024-07-06 DIAGNOSIS — M5442 Lumbago with sciatica, left side: Principal | ICD-10-CM

## 2024-07-06 NOTE — Unmapped (Signed)
 [facility] Internal Medicine at [facility]     Are you located in [place]? yes    Reason for visit: Follow up    Questions / Concerns that need to be addressed: Patient will discuss with provider    PTHomeBP     Diabetes:  Regularly checking blood sugars?: no  If yes, when? Complete log for past 7 days  Date Before Breakfast After Breakfast Before Lunch After Lunch Before Dinner After Dinner Before Bed                                                                                                                                   HCDM reviewed and updated in Epic:    We are working to make sure all of our patients??? wishes are updated in Epic and part of that is documenting a Environmental health practitioner for each patient  A Health Care Decision Maker is someone you choose who can make health care decisions for you if you are not able - who would you most want to do this for you????  is already up to date.    HCDM (patient stated preference): Riley,Patricia - Mother - [phone]    BPAs completed:  N/A    __________________________________________________________________________________________    SCREENINGS COMPLETED IN FLOWSHEETS      AUDIT       PHQ2       PHQ9          GAD7       COPD Assessment       Falls Risk

## 2024-07-06 NOTE — Unmapped (Signed)
 [facility] Specialty and Home Delivery Pharmacy Refill Coordination Note    Specialty Medication(s) to be Shipped:   Infectious Disease: Biktarvy     Other medication(s) to be shipped: fluticasone , loratadine     Specialty Medications not needed at this time: n/a- patient gets no other specialty medications at [facility]  Patient declined ALL other refills at this time due to supply on hand, she is aware to contact [facility] when down to 1 week on hand of ANY medication to avoid delays     Kristy Garcia, DOB: 06-02-1966  Phone: [phone] (home)       All above HIPAA information was verified with patient.     Was a Nurse, learning disability used for this call? No    Completed refill call assessment today to schedule patient's medication shipment from the [facility] Delivery Pharmacy  [phone]).  All relevant notes have been reviewed.     Specialty medication(s) and dose(s) confirmed: Regimen is correct and unchanged.   Changes to medications: Kristy Garcia reports no changes at this time.  Changes to insurance: No  New side effects reported not previously addressed with a pharmacist or physician: None reported  Questions for the pharmacist: No    Confirmed patient received a Conservation officer, historic buildings and a Surveyor, mining with first shipment. The patient will receive a drug information handout for each medication shipped and additional FDA Medication Guides as required.       DISEASE/MEDICATION-SPECIFIC INFORMATION        N/A    SPECIALTY MEDICATION ADHERENCE     Medication Adherence    Patient reported X missed doses in the last month: 0  Specialty Medication: biktarvy  50-200-25  Patient is on additional specialty medications: No              Were doses missed due to medication being on hold? No    Biktarvy  50-200-25  : 8 days of medicine on hand       REFERRAL TO PHARMACIST     Referral to the pharmacist: Not needed      [facility]     Shipping address confirmed in Epic.     Cost and Payment: Patient has a copay of $0 biktarvy , $4 flonase , $2.99 claritin  however note added that medicaid patient (and all 3 meds covered under medicaid) says unable to afford. They are aware and have authorized the pharmacy to charge the credit card on file.    Delivery Scheduled: Yes, Expected medication delivery date: 07/10/2024.     Medication will be delivered via Next Day Courier to the prescription address in Epic WAM.    Kristy Garcia, PharmD   [facility] Delivery Pharm[facility]  Specialty Pharmacist

## 2024-07-06 NOTE — Unmapped (Addendum)
 Use heating pads on your back to help relax the muscle  You have no signs or symptoms of severe spinal cord injury or infection, and it seems like your back muscles are the culprit    I have referred you to physical therapy in [place] close to where you live.    Transportation: [facility] Transportation Resources: [employer] - [facility]   [employer]   Phone: [phone]?      [place]?Transit:?   Phone: [phone])?   Website: LocalTux.com.cy?   Information: Monthly passes as well as discount passes for disabled and elderly. Local bus service as well as buses to [place], [place] and [place].?      [employer]?ACCESS:?   Phone: [phone]?   Website: WeirdColors.co.za?   Provides ADA paratransit service for eligible riders to all locations within the [employer] [place] of [place] and to any location outside the [place] that is three-quarters of a mile of any fixed-route bus route operated by [employer]?      [employer]?ACCESS:??   Phone: [phone] (TTY)?or?[phone]??   Website: TanningAlert.cz?   Information: Operates its fixed-route and paratransit services in accordance with the Americans with Disabilities Act and is designed to accommodate the mobility needs of individuals with disabilities or functional limitations.?      MANAGED MEDICAID TRANSPORTATION RESOURCES   Identify your Medicaid plan and follow the instructions regarding emergency/non-emergency transportation services.?      [employer]?   [employer]?Phone: [phone]?   Information:?   Call up to 2 days before your appointment to arrange transportation??   If you need an attendant to go with you to your doctor's appointment, or if your child (80 years old or younger) is a member of the plan, transportation is also covered for the attendant, parent or guardian.??   Non-emergency transportation includes personal vehicles, taxis, vans, mini-buses, mountain area transport and public transportation.?   For more information, visit: GiftContent.se.aspx    ?   [place] [facility]?   [employer]?Phone: [phone]?   For more information, visit: PlayDentist.[email or web address].html       ?   Healthy Blue?   [employer]?Phone: [phone]?   Information:?   Call at least 48 hours (two days)?before your appointment.??   When you call, have this information ready:?   Your member ID number (on your Healthy Blue member ID card)?   Address where you want to be picked up?   Date and time of your appointment?   Name, Address, and phone number of the provider??   If you use a wheelchair or other mobility equipment?   For children aged?16 and younger, name of the adult who will go with the child?   Call Member Services?[phone] (TTY [phone]) for help with urgent trips.?   For more information on how to set up your ride, visit: [email or web address]    ?   UnitedHealthcare?   [employer]?Phone: [phone]?   Information:??   Call up to 2 days before your appointment, between 7am and 6pm?to arrange transportation to and from your appointment??   [place] Health Choice members are not eligible to receive non-emergency transportation services.??   If you need an attendant to go with you to your doctor's appointment, or if your child (60 years old or younger) is a member of the plan, transportation is also covered for the attendant, parent or guardian.??   Non-emergency transportation services via personal vehicles, taxis, vans, mini-buses, mountain area transports and public transportation.?   For more information,  visit: https://www.uhcprovider.com/en/health-plans-by-state/[place]-health-plans/[place]-comm-plan-home/[place]-cp-transportation.html    ?   WellCare?   Call?One Call?at?[phone]?or WellCare Member Services?at?[phone] (TTY: 711)?and select option for ???TRANSPORTATION??? ?   Information:?   Call up to 2 business days before your appointment??   If your need is urgent, select ???Urgent Trip Request??? when you call.?   Have your member ID and any other important papers with you. ?   Drivers will wait up to 5 minutes after arrival for pickup.??   For more information, visit: MiniLending.co.uk.html    ?   [place] Medicaid Direct and EBCI Tribal Option?   Contact your local DSS: http://stevens-collins.[email or web address]/?     ?   PRIVATE PAY PROVIDERS      [employer]?   Phone: [phone]??   Email: [email or web address]   Website: OutsidePets.uy    Provides services to those who are in wheelchairs or non-ambulatory. Provide door to door services to and from physician appointments, hospital discharges, facility transfers, cancer/dialysis treatments, etc. Based out of [facility], [place].??   ?   Just for you Transportation?   Phone: [phone] [place])   Services based out of [place]. The cost?is $20 for the first hour and then $5 for every 5 minutes beyond.?   ?   Specialty Care Transport?   Phone: [phone]?   Services based out of [place]. Provides services to those who are in wheelchairs or non-ambulatory. Can Psychologist, educational wheelchairs.?Call for more information.    ?   [place] Transportation?   Phone: [phone]??   Website: [email or web address].kingdomtransportation.[email or web address]/    Services based out of [place]. Provides services to those who are in wheelchairs or non-ambulatory.??   ??   ?   NON-EMERGENCY AMBULANCE TRANSPORTATION RESOURCES ?      [facility] Non-Emergency?Ambulance Transport ??   Phone:[phone]?   Website: Manufacturing systems engineer Area: [place], [place], [place], [place], [place], [place], [place], [place], [place], [place], [place], [place], [place], [place], and [place]   ?   Priority Care?   Phone: [phone]?   Website: AffordableBoarding.cz    ?   HealthFirst?Transport??   Phone: [phone]?   Website: [email or web address].healthfirsttrans.[email or web address]    ?   First Choice?Medical Transport?   Phone: [phone]?   Website: [email or web address].no

## 2024-07-09 MED FILL — FLUTICASONE PROPIONATE 50 MCG/ACTUATION NASAL SPRAY,SUSPENSION: NASAL | 60 days supply | Qty: 16 | Fill #4

## 2024-07-09 MED FILL — LORATADINE 10 MG TABLET: ORAL | 30 days supply | Qty: 30 | Fill #2

## 2024-07-12 NOTE — Unmapped (Signed)
 This was a telehealth service performed by a resident. Immediately after or during the visit, I reviewed the case in person with the resident, including the medical decision making. I discussed with the resident the patient's diagnosis and concur with the treatment plan as documented in the resident's note.   Trina Ao, MD

## 2024-07-27 DIAGNOSIS — Z79899 Other long term (current) drug therapy: Principal | ICD-10-CM

## 2024-07-27 DIAGNOSIS — E782 Mixed hyperlipidemia: Principal | ICD-10-CM

## 2024-07-27 DIAGNOSIS — M2550 Pain in unspecified joint: Principal | ICD-10-CM

## 2024-07-27 DIAGNOSIS — M25572 Pain in left ankle and joints of left foot: Principal | ICD-10-CM

## 2024-07-27 DIAGNOSIS — G894 Chronic pain syndrome: Principal | ICD-10-CM

## 2024-07-27 DIAGNOSIS — M5442 Lumbago with sciatica, left side: Principal | ICD-10-CM

## 2024-07-27 DIAGNOSIS — M5441 Lumbago with sciatica, right side: Principal | ICD-10-CM

## 2024-07-27 DIAGNOSIS — G8929 Other chronic pain: Principal | ICD-10-CM

## 2024-07-27 MED ORDER — PRAVASTATIN 40 MG TABLET
ORAL_TABLET | Freq: Every day | ORAL | 3 refills | 90.00000 days
Start: 2024-07-27 — End: ?

## 2024-07-27 MED ORDER — PREGABALIN 150 MG CAPSULE
ORAL_CAPSULE | Freq: Two times a day (BID) | ORAL | 3 refills | 90.00000 days | Status: CP
Start: 2024-07-27 — End: 2025-07-22
  Filled 2024-08-01: qty 180, 90d supply, fill #0

## 2024-07-27 NOTE — Unmapped (Signed)
 Addended by: MYRNA KAY R on: 07/27/2024 05:09 PM     Modules accepted: Orders

## 2024-07-27 NOTE — Unmapped (Signed)
 [facility] Specialty and Home Delivery Pharmacy Refill Coordination Note    Specialty Medication(s) to be Shipped:   Infectious Disease: Biktarvy     Other medication(s) to be shipped: Lidocaine   patch  ,  Ventolin  ,  Symbicort   ,  Propylthiouracil   ,   Prevastatin , Lyrica  ,  Quetiapine   ,  Trazodone        Specialty Medications not needed at this time: N/A     Kristy Garcia, DOB: 05-Oct-1966  Phone: [phone] (home)       All above HIPAA information was verified with patient.     Was a Nurse, learning disability used for this call? No    Completed refill call assessment today to schedule patient's medication shipment from the [facility] Delivery Pharm[facility]  [phone]).  All relevant notes have been reviewed.     Specialty medication(s) and dose(s) confirmed: Regimen is correct and unchanged.   Changes to medications: Anetra reports no changes at this time.  Changes to insurance: No  New side effects reported not previously addressed with a pharmacist or physician: None reported  Questions for the pharmacist: No    Confirmed patient received a Conservation officer, historic buildings and a Surveyor, mining with first shipment. The patient will receive a drug information handout for each medication shipped and additional FDA Medication Guides as required.       DISEASE/MEDICATION-SPECIFIC INFORMATION        N/A    SPECIALTY MEDICATION ADHERENCE     Medication Adherence    Patient reported X missed doses in the last month: 0  Specialty Medication: bictegrav-emtricit-tenofov ala: BIKTARVY  50-200-25 mg tablet  Patient is on additional specialty medications: No  Patient is on more than two specialty medications: No  Any gaps in refill history greater than 2 weeks in the last 3 months: no  Demonstrates understanding of importance of adherence: yes              Were doses missed due to medication being on hold? No         bictegrav-emtricit-tenofov ala: BIKTARVY  50-200-25 mg tablet : 7  days of medicine on hand        REFERRAL TO PHARMACIST     Referral to the pharmacist: Not needed      [facility]     Shipping address confirmed in Epic.     Cost and Payment: Patient has a copay of $39.00 _ pt  cannot afford  copay at this time   . They are aware and have authorized the pharmacy to charge the credit card on file.    Delivery Scheduled: Yes, Expected medication delivery date: 08/01/24 .     Medication will be delivered via Same Day Courier to the prescription address in Epic WAM.    Tawni Daring   [facility] Special[facility] and Home Delivery Pharmacy  Specialty Technician

## 2024-07-27 NOTE — Unmapped (Signed)
 error

## 2024-07-31 MED ORDER — PRAVASTATIN 40 MG TABLET
ORAL_TABLET | Freq: Every day | ORAL | 3 refills | 100.00000 days | Status: CP
Start: 2024-07-31 — End: ?
  Filled 2024-08-01: qty 90, 90d supply, fill #0

## 2024-08-01 MED FILL — SYMBICORT 80 MCG-4.5 MCG/ACTUATION HFA AEROSOL INHALER: RESPIRATORY_TRACT | 31 days supply | Qty: 10.2 | Fill #0

## 2024-08-01 MED FILL — PROPYLTHIOURACIL 50 MG TABLET: ORAL | 50 days supply | Qty: 100 | Fill #1

## 2024-08-01 MED FILL — BIKTARVY 50 MG-200 MG-25 MG TABLET: ORAL | 30 days supply | Qty: 30 | Fill #1

## 2024-08-01 MED FILL — TRAZODONE 50 MG TABLET: ORAL | 90 days supply | Qty: 90 | Fill #0

## 2024-08-01 MED FILL — QUETIAPINE 25 MG TABLET: ORAL | 90 days supply | Qty: 90 | Fill #1

## 2024-08-01 MED FILL — LIDOCAINE 5 % TOPICAL PATCH: TRANSDERMAL | 30 days supply | Qty: 30 | Fill #6

## 2024-08-01 MED FILL — VENTOLIN HFA 90 MCG/ACTUATION AEROSOL INHALER: RESPIRATORY_TRACT | 17 days supply | Qty: 18 | Fill #5

## 2024-08-06 NOTE — Unmapped (Signed)
 Internal Medicine Clinic Visit    Reason for visit: follow up    A/P:  Assessment & Plan  HIV infection  HIV infection well-controlled, undetectable viral load, CD4 count 1250.  - Continue Biktarvy  as prescribed.    Hypertension  Hypertension well-controlled on current regimen, BP 128/68 mmHg.  - Continue olmesartan  20 mg daily.  - Continue amlodipine   10 mg as prescribed.    Hyperlipidemia  Pravastatin  may be less effective; switch to atorvastatin  for better cardiovascular protection.  - Discontinue pravastatin .  - Start atorvastatin  20 mg .  - Send atorvastatin  prescription to home delivery pharmacy.    Chronic pain  Chronic pain management stable; agreed on management by primary care.  - Continue duloxetine  (Cymbalta ) as prescribed. Cont lyrica     Tobacco use disorder  Progress in reducing smoking frequency with varenicline .  - Continue varenicline  (Chantix ) as prescribed.  Arthralgia of hands  Intermittent severe pain and stiffness, possibly arthritis or carpal tunnel syndrome.  - Recommend purchasing wrist braces for nighttime use.        Health Maintenance  Health Maintenance Due   Topic Date Due    Mammogram  09/17/2021    Given number to schedule mammogram    Return in about 6 months (around 02/04/2025).  Next Visit:   Discuss alcohol use, ask about job, make sure she has had lung CT  Orders Placed This Encounter   Procedures    INFLUENZA VACCINE IIV3(IM)(PF)6 MOS UP      HPI:  History of Present Illness  Kristy Garcia is a 58 year old female with arthritis and hypertension who presents with hand pain and medication management.    She has been experiencing severe pain in both hands for the past one to two weeks, describing it as extremely intense. The pain is accompanied by morning stiffness and some swelling. She has not used wrist braces due to lack of insurance coverage.    Her current medication regimen includes olmesartan  20 mg for hypertension, which maintains her blood pressure at 128/68 mmHg. She also takes amlodipine  and pravastatin  for cholesterol management. Duloxetine  (Cymbalta ) is used for pain management.    She has a history of HIV, with a viral load that was undetectable in July and a CD4 count of 1250. She continues to take Biktarvy  for HIV management.    She experiences occasional shortness of breath, attributing it to inconsistent use of her inhalers. She has previously consulted a lung specialist.    She experiences back pain that varies with activity. She uses patches for relief but is not interested in physical therapy due to her commitments with work and school.    She smokes, with a pack lasting three to four days during busy weeks, and is using Chantix  (varenicline ) to aid in smoking cessation.    She is currently living with her aunt, who has kidney issues. She assists her aunt with meals but notes that her aunt is largely independent.    PTHomeBP   Problem List:  Problem List[1]    Medications:  Reviewed in EPIC  Soc History and Family History reviewed in Epic    Physical Exam:   Vital Signs:  Vitals:    08/07/24 0959   BP: 128/68   BP Site: L Arm   BP Position: Sitting   Pulse: 66   Resp: 16   Temp: 36.4 ??C (97.6 ??F)   TempSrc: Oral   SpO2: 97%   Weight: 72.7 kg (160 lb 3.2 oz)  Height: 162.6 cm (5' 4.02)     Wt Readings from Last 3 Encounters:   08/07/24 72.7 kg (160 lb 3.2 oz)   06/18/24 62.7 kg (138 lb 3.3 oz)   06/18/24 73 kg (161 lb)      Physical Exam  VITALS: BP- 128/68  CHEST: Lungs clear to auscultation bilaterally.  CARDIOVASCULAR: Heart sounds normal.    Records review  Results  LABS  HIV Viral Load: Not detected (05/2024)  CD4+ T-lymphocyte Count: 1250 cells/mm?? (05/2024)    Lab Results   Component Value Date    CREATININE 0.75 07/01/2024    CHOL 138 08/18/2023    HDL 40 08/18/2023    LDL 66 08/18/2023    NONHDL 98 08/18/2023    TRIG 837 (H) 08/18/2023    A1C 6.2 (H) 12/28/2023         The 10-year ASCVD risk score (Arnett DK, et al., 2019) is: 10.9%     Medication adherence and barriers to the treatment plan have been addressed. Opportunities to optimize healthy behaviors have been discussed. Patient / caregiver voiced understanding.    I personally spent 23 minutes face-to-face and non-face-to-face in the care of this patient, which includes all pre, intra, and post visit time on the date of service.               [1]   Patient Active Problem List  Diagnosis    Human immunodeficiency virus (HIV) disease    (CMS-HCC)    Hyperthyroidism    Suicide and self-inflicted injury    (CMS-HCC)    Tobacco use disorder    Cocaine use disorder, mild, in sustained remission, abuse (CMS-HCC)    Alcohol use disorder, moderate, in sustained remission    (CMS-HCC)    Essential hypertension (RAF-HCC)    Hyperlipidemia    Osteopenia    Sciatica of right side    Cervical dysplasia    Joint pain in both hands    Chronic pain of left ankle    Arthralgia of multiple sites, bilateral    Moderate episode of recurrent major depressive disorder (CMS-HCC)    Encounter for therapeutic drug monitoring    Chronic midline low back pain with bilateral sciatica    Primary osteoarthritis of both hands    Chronic pain syndrome    Encounter for long-term (current) use of high-risk medication    Myalgia, multiple sites    Centrilobular emphysema    (CMS-HCC)    Blurred vision    Right sided temporal headache    Abnormal serum thyroid stimulating hormone (TSH) level    Asthma-COPD overlap syndrome    (CMS-HCC)

## 2024-08-07 DIAGNOSIS — G8929 Other chronic pain: Principal | ICD-10-CM

## 2024-08-07 DIAGNOSIS — I10 Essential (primary) hypertension: Principal | ICD-10-CM

## 2024-08-07 DIAGNOSIS — M19042 Primary osteoarthritis, left hand: Principal | ICD-10-CM

## 2024-08-07 DIAGNOSIS — E785 Hyperlipidemia, unspecified: Principal | ICD-10-CM

## 2024-08-07 DIAGNOSIS — M19041 Primary osteoarthritis, right hand: Principal | ICD-10-CM

## 2024-08-07 DIAGNOSIS — M5441 Lumbago with sciatica, right side: Principal | ICD-10-CM

## 2024-08-07 DIAGNOSIS — E0590 Thyrotoxicosis, unspecified without thyrotoxic crisis or storm: Principal | ICD-10-CM

## 2024-08-07 DIAGNOSIS — B20 Human immunodeficiency virus [HIV] disease: Principal | ICD-10-CM

## 2024-08-07 DIAGNOSIS — F331 Major depressive disorder, recurrent, moderate: Principal | ICD-10-CM

## 2024-08-07 DIAGNOSIS — F17200 Nicotine dependence, unspecified, uncomplicated: Principal | ICD-10-CM

## 2024-08-07 DIAGNOSIS — M5442 Lumbago with sciatica, left side: Principal | ICD-10-CM

## 2024-08-07 MED ORDER — ATORVASTATIN 20 MG TABLET
ORAL_TABLET | Freq: Every day | ORAL | 3 refills | 100.00000 days | Status: CP
Start: 2024-08-07 — End: 2025-08-07
  Filled 2024-08-09: qty 90, 90d supply, fill #0

## 2024-08-07 NOTE — Unmapped (Signed)
 Kristy Garcia, it was good to see you today.   This is what we discussed:   Please call Radiology to schedule your imaging test if you have not heard from them.  The number is [phone].  You can request any location in [place], [place], or any other [facility] location closest to you.  The [employer] where our office is located does offere different types of imaging tests, but not all imaging tests are done here.
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
## 2024-08-24 MED FILL — ATORVASTATIN 20 MG TABLET: ORAL | 90 days supply | Qty: 90 | Fill #1

## 2024-08-24 NOTE — Unmapped (Signed)
 [facility] Special[facility] and Home Delivery Pharmacy Refill Coordination Note    Specialty Medication(s) to be Shipped:   Infectious Disease: Biktarvy     Other medication(s) to be shipped: No additional medications requested for fill at this time    Specialty Medications not needed at this time: N/A     Kristy Garcia, DOB: 26-May-1966  Phone: [phone] (home)       All above HIPAA information was verified with patient.     Was a Nurse, learning disability used for this call? No    Completed refill call assessment today to schedule patient's medication shipment from the [facility] and Home Delivery Pharmacy  [phone]).  All relevant notes have been reviewed.     Specialty medication(s) and dose(s) confirmed: Regimen is correct and unchanged.   Changes to medications: Myracle reports no changes at this time.  Changes to insurance: No  New side effects reported not previously addressed with a pharmacist or physician: None reported  Questions for the pharmacist: No    Confirmed patient received a Conservation officer, historic buildings and a Surveyor, mining with first shipment. The patient will receive a drug information handout for each medication shipped and additional FDA Medication Guides as required.       DISEASE/MEDICATION-SPECIFIC INFORMATION        N/A    SPECIALTY MEDICATION ADHERENCE     Medication Adherence    Patient reported X missed doses in the last month: 0  Specialty Medication: Biktarvy  50-200-25 mg  Patient is on additional specialty medications: No  Informant: patient     Were doses missed due to medication being on hold? No    Biktarvy  50-200-25 mg: 14 days of medicine on hand       REFERRAL TO PHARMACIST     Referral to the pharmacist: Not needed      [facility]     Shipping address confirmed in Epic.     Cost and Payment: Patient has a $0 copay, payment information is not required.    Delivery Scheduled: Yes, Expected medication delivery date: 09/04/24.     Medication will be delivered via Next Day Courier to the prescription address in Epic [place].    Hosteen Kienast M Santer Torres   [place] [facility]  Specialty Technician

## 2024-08-24 NOTE — Unmapped (Signed)
 [facility] and Home Delivery Pharm[facility] - Replacement Package    Zunairah Kiana Hollar 's package containing Atorvastatin  is being replaced due to lost in transit.    UPS Tracking Number: [id number]    Replacement package approved and scheduled for delivery via UPS to the prescription address in Epic [place] on 08/27/2024    Note: Package shows as returned to [facility] - unable to locate returned package at this time.

## 2024-09-02 MED FILL — BIKTARVY 50 MG-200 MG-25 MG TABLET: ORAL | 30 days supply | Qty: 30 | Fill #2

## 2024-09-18 DIAGNOSIS — J302 Other seasonal allergic rhinitis: Principal | ICD-10-CM

## 2024-09-18 MED ORDER — LORATADINE 10 MG TABLET
ORAL_TABLET | Freq: Every day | ORAL | 0 refills | 90.00000 days
Start: 2024-09-18 — End: 2025-09-18

## 2024-09-18 MED ORDER — FLUTICASONE PROPIONATE 50 MCG/ACTUATION NASAL SPRAY,SUSPENSION
Freq: Every day | NASAL | 5 refills | 120.00000 days
Start: 2024-09-18 — End: 2025-09-18

## 2024-09-18 NOTE — Progress Notes (Signed)
 [facility] Special[facility] and Home Delivery Pharmacy Refill Coordination Note    Specialty Medication(s) to be Shipped:   Infectious Disease: Biktarvy     Other medication(s) to be shipped: DULoxetine  60 MG capsule (CYMBALTA ),fluticasone  propionate 50 mcg/actuation nasal spray (FLONASE ),loratadine  10 mg tablet (CLARITIN ),budesonide -formoterol : SYMBICORT  80-4.5 mcg/actuation inhaler,albuterol : VENTOLIN  HFA 90 mcg/actuation inhaler    Specialty Medications not needed at this time: N/A     Kristy Garcia, DOB: [birth year]  Phone: [phone] (home)       All above HIPAA information was verified with patient.     Was a nurse, learning disability used for this call? No    Completed refill call assessment today to schedule patient's medication shipment from the [facility] and Home Delivery Pharmacy  [phone]).  All relevant notes have been reviewed.     Specialty medication(s) and dose(s) confirmed: Regimen is correct and unchanged.   Changes to medications: [name] reports no changes at this time.  Changes to insurance: No  New side effects reported not previously addressed with a pharmacist or physician: None reported  Questions for the pharmacist: No    Confirmed patient received a Conservation Officer, Historic Buildings and a Surveyor, Mining with first shipment. The patient will receive a drug information handout for each medication shipped and additional FDA Medication Guides as required.       DISEASE/MEDICATION-SPECIFIC INFORMATION        N/A    SPECIALTY MEDICATION ADHERENCE     Medication Adherence    Patient reported X missed doses in the last month: 0  Specialty Medication: bictegrav-emtricit-tenofov ala: BIKTARVY  50-200-25 mg tablet  Patient is on additional specialty medications: No              Were doses missed due to medication being on hold? No     bictegrav-emtricit-tenofov ala: BIKTARVY  50-200-25 mg tablet: 14 days of medicine on hand       REFERRAL TO PHARMACIST     Referral to the pharmacist: Not needed      [facility] Shipping address confirmed in Epic.     Cost and Payment: Patient has a copay of $18.99. They are aware and have authorized the pharmacy to charge the credit card on file.    Delivery Scheduled: Yes, Expected medication delivery date: 09/25/2024.  However, Rx request for refills was sent to the provider as there are none remaining.       Medication will be delivered via Same Day Courier to the prescription address in Epic [facility].    [name]   [facility] and [facility] Delivery Pharm[facility]  Specialty Technician

## 2024-09-19 MED ORDER — LORATADINE 10 MG TABLET
ORAL_TABLET | Freq: Every day | ORAL | 3 refills | 90.00000 days | Status: CP
Start: 2024-09-19 — End: 2025-09-19
  Filled 2024-09-25: qty 30, 30d supply, fill #0

## 2024-09-19 MED ORDER — FLUTICASONE PROPIONATE 50 MCG/ACTUATION NASAL SPRAY,SUSPENSION
Freq: Every day | NASAL | 5 refills | 120.00000 days | Status: CP
Start: 2024-09-19 — End: 2025-09-19
  Filled 2024-09-25: qty 16, 60d supply, fill #0

## 2024-09-25 MED FILL — VENTOLIN HFA 90 MCG/ACTUATION AEROSOL INHALER: RESPIRATORY_TRACT | 17 days supply | Qty: 18 | Fill #6

## 2024-09-25 MED FILL — BIKTARVY 50 MG-200 MG-25 MG TABLET: ORAL | 30 days supply | Qty: 30 | Fill #3

## 2024-09-25 MED FILL — SYMBICORT 80 MCG-4.5 MCG/ACTUATION HFA AEROSOL INHALER: RESPIRATORY_TRACT | 30 days supply | Qty: 10.2 | Fill #1

## 2024-09-25 MED FILL — DULOXETINE 60 MG CAPSULE,DELAYED RELEASE: ORAL | 90 days supply | Qty: 90 | Fill #1

## 2024-10-09 NOTE — Telephone Encounter (Signed)
 Name: Kristy Garcia  Date: 10/09/2024  Address: [address]  [place] [address]   [place] of Residence:  [place]  Phone: [phone]     Started assessment with patient options: phone call    Is this the same address for mailing? Yes  If no, Mailing Address is:     What is your preferred method of contact? Phone Call    Is there anyone that you would want to add as your personal contact? No; if yes, please use SmartPhrase RWPersonalContactInfo to gather their contacts information.    N/a    Housing Status  Stable/Permanent; if so, what is their housing type: Permanent placement with families or other self - sufficient arrangements     Insurance  Medicaid    Marital Status  Single    Tax Press Photographer Status  I did not file taxes     Employment Status  Unemployed    Income  No Household Income/Deductions of any kind    If no or low income, how are you meeting your basic needs?  Food Stamps/EBT    List Tax Household Members including relationship to you:   N/a    Someone in my household receives: No Household Income/Deductions of any kind  Specify who: n/a    Do you or anyone in your home have income adjustments? No    If yes, which adjustments do you have? N/A      Medication Access/Barriers: n/a    Do you have a current diagnosis for Hepatitis C?  Lab Results   Component Value Date    HEPCAB Nonreactive 03/25/2021       Federal Marketplace Eligibility Assessment  Patient has affordable insurance through [employer], [place], and or Employment and is not eligible.    Patient given ACA education if they qualified based on answers to questions above.     MyChart  Do you have an active MyChart account? Yes     If MyChart is not set up, informed patient on how to set up MyChart N/A    Patient was informed of the following programs;   N/A    The following applications/handouts were given to patient:   N/A    The following forms were also started with the patient:   N/A    Medicaid:  Approved      [place]/HMAP application status: Complete    Patient is applying for [employer] on Charges Only     Additional Comments: No issues accessing meds.    ____________________________________________________________________    The patient provides verbal consent and agrees to the following:    RW Consent  I agree to notify the interviewer within 30 days about any changes to my address, financial resources, expenses, family situation, or health insurance coverage that may impact my eligibility for Department payment programs. I certify that the information I have provided is accurate and complete to the best of my knowledge. I understand that information provided may be verified by a state reviewer, and I agree to submit any necessary financial records required for this review. I also understand that my employer may be asked to verify information concerning my income.    Caps on Charges Consent  The [employer] Program requires that both insured and uninsured individuals be charged no more than a specified maximum amount in a calendar year, based on their individual income.  [name]  ID Clinic Benefits Counselor  Time of Intervention-48mins

## 2024-10-10 NOTE — Progress Notes (Signed)
 Patient completed [employer] only application.. Eligible for RW B&C grant services and Caps on Charges. IPL= 0%, FPL= 0%. Expires 11/21/2025- submitted     RW Eligibility Form informing patient about RW services and Caps on charges was sent to patient via MyChart Message              [name]  ID Clinic Benefits Counselor  Time of Intervention-27mins

## 2024-10-16 NOTE — Progress Notes (Signed)
..  Kristy Garcia has been contacted in regards to their refill of BIKTARVY   . At this time, they have declined refill due to patient having 15 doses remaining. Refill assessment call date has been updated per the patient's request.

## 2024-10-23 ENCOUNTER — Ambulatory Visit: Admit: 2024-10-23 | Payer: PRIVATE HEALTH INSURANCE | Attending: "Endocrinology | Primary: "Endocrinology

## 2024-10-23 DIAGNOSIS — M25572 Pain in left ankle and joints of left foot: Principal | ICD-10-CM

## 2024-10-23 DIAGNOSIS — G8929 Other chronic pain: Principal | ICD-10-CM

## 2024-10-23 DIAGNOSIS — Z79899 Other long term (current) drug therapy: Principal | ICD-10-CM

## 2024-10-23 DIAGNOSIS — I10 Essential (primary) hypertension: Principal | ICD-10-CM

## 2024-10-23 DIAGNOSIS — G894 Chronic pain syndrome: Principal | ICD-10-CM

## 2024-10-23 MED ORDER — OLMESARTAN 20 MG TABLET
ORAL_TABLET | Freq: Every day | ORAL | 2 refills | 100.00000 days | Status: CP
Start: 2024-10-23 — End: ?
  Filled 2024-10-29: qty 90, 90d supply, fill #0

## 2024-10-23 MED ORDER — AMLODIPINE 10 MG TABLET
ORAL_TABLET | Freq: Every day | ORAL | 2 refills | 100.00000 days | Status: CP
Start: 2024-10-23 — End: ?
  Filled 2024-10-29: qty 90, 90d supply, fill #0

## 2024-10-23 NOTE — Progress Notes (Signed)
 [facility] Refill Coordination Note    Specialty Medication(s) to be Shipped:   Infectious Disease: BIKTARVY  50-200-25 mg tablet (bictegrav-emtricit-tenofov ala)    Other medication(s) to be shipped: SYMBICORT  80-4.5 mcg/actuation inhaler (budesonide -formoterol ) VENTOLIN  HFA 90 mcg/actuation inhaler (albuterol ) lidocaine  5 % patch (LIDODERM ) amlodipine  10 MG tablet (NORVASC ) celecoxib  100 MG capsule (CeleBREX ) olmesartan  20 MG tablet (BENICAR ) propylthiouracil  50 mg tablet (PTU) loratadine  10 mg tablet (CLARITIN )     Specialty Medications not needed at this time: N/A     Chrissie Jamee Quale, DOB: 04-Jun-1966  Phone: [phone] (home)       All above HIPAA information was verified with patient.     Was a nurse, learning disability used for this call? No    Completed refill call assessment today to schedule patient's medication shipment from the [facility]  [phone]).  All relevant notes have been reviewed.     Specialty medication(s) and dose(s) confirmed: Regimen is correct and unchanged.   Changes to medications: Ulah reports no changes at this time.  Changes to insurance: No  New side effects reported not previously addressed with a pharmacist or physician: None reported  Questions for the pharmacist: No    Confirmed patient received a Conservation Officer, Historic Buildings and a Surveyor, Mining with first shipment. The patient will receive a drug information handout for each medication shipped and additional FDA Medication Guides as required.       DISEASE/MEDICATION-SPECIFIC INFORMATION        N/A    SPECIALTY MEDICATION ADHERENCE     Medication Adherence    Patient reported X missed doses in the last month: 0  Specialty Medication: BIKTARVY  50-200-25 mg tablet (bictegrav-emtricit-tenofov ala)  Patient is on additional specialty medications: No  Informant: patient              Were doses missed due to medication being on hold? No    BIKTARVY  50-200-25 mg tablet (bictegrav-emtricit-tenofov ala): 7 days of medicine on hand       REFERRAL TO PHARMACIST     Referral to the pharmacist: Not needed      [facility]     Shipping address confirmed in Epic.     Cost and Payment: Patient unable to afford copays of $31 at this time [facility])    Delivery Scheduled: Yes, Expected medication delivery date: 12/9.     Medication will be delivered via Next Day Courier to the prescription address in Epic WAM.    Jeoffrey JAYSON Sherra [facility] and [facility]

## 2024-10-29 MED FILL — BIKTARVY 50 MG-200 MG-25 MG TABLET: ORAL | 30 days supply | Qty: 30 | Fill #4

## 2024-10-29 MED FILL — SYMBICORT 80 MCG-4.5 MCG/ACTUATION HFA AEROSOL INHALER: RESPIRATORY_TRACT | 30 days supply | Qty: 10.2 | Fill #2

## 2024-10-29 MED FILL — PROPYLTHIOURACIL 50 MG TABLET: ORAL | 50 days supply | Qty: 100 | Fill #2

## 2024-10-29 MED FILL — LORATADINE 10 MG TABLET: ORAL | 30 days supply | Qty: 30 | Fill #1

## 2024-10-29 MED FILL — CELECOXIB 100 MG CAPSULE: ORAL | 90 days supply | Qty: 360 | Fill #1

## 2024-10-29 MED FILL — VENTOLIN HFA 90 MCG/ACTUATION AEROSOL INHALER: RESPIRATORY_TRACT | 17 days supply | Qty: 18 | Fill #7
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
## 2024-12-03 NOTE — Progress Notes (Signed)
 The [facility] has made a second and final attempt to reach this patient to refill the following medication:Biktarvy .      We have left voicemails on the following phone numbers: [phone], have sent a MyChart message, have sent a text message to the following phone numbers: [phone], and have sent a Mychart questionnaire..    Dates contacted: 1/8, 1/12  Last scheduled delivery: 10/29/2024    The patient may be at risk of non-compliance with this medication. The patient should call the [facility] at [phone]  Option 4, then Option 4: Infectious Disease, Transplant to refill medication.    Lamarr CHRISTELLA Dross   [facility] Specialty Technician

## 2024-12-10 NOTE — Progress Notes (Signed)
 CHRONIC PAIN INITIAL VISIT  Kristy Angle, MD      Date: December 11, 2024  Patient Name: Kristy Garcia  MRN: [id number]  PCP: Myrna Velia Canales    Assessment/Plan:     Kristy Garcia is a 59 y.o. female with a PMH of chronic polyarthralgia and myalgia, polyneuropathy, likely centralized pain who presented for evaluation and recommendations for chronic pain management.  - Patient's pain is manageable.  - Benefits and risks of ongoing treatment reviewed.   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 Topamax  considered for nerve pain and weight management.  - Prescribed lidocaine  patches, 15/month for 90 days.  - Prescribed Voltaren  cream for hands.  - Reduced Lyrica  to 125 mg twice daily from 150mg  twice daily.  - Start Topamax  25mg  at night.  - Continued duloxetine  60 mg daily.  - Continued Celebrex  200 mg twice daily.  - Scheduled follow-up in one month via video visit.    Major depressive disorder, recurrent, moderate  Mood stable with duloxetine . No self-harm thoughts.  - Continue duloxetine  60 mg daily.    Insomnia  Trazodone  effective at higher dose. Topamax  added to aid sleep and reduce Lyrica .  - Continue trazodone  at current dose.  - Added Topamax  at night.    Localized edema of hands and feet  Edema in hands and feet, possibly linked to Lyrica . No shortness of breath.  - Discuss swelling with primary care physician for further evaluation.    Nicotine  dependence, cigarettes  Nicotine  dependence with half pack/day use. Plans to quit with dental work upcoming.  - Encouraged smoking cessation.  - Continue Chantix  and nicotine  gum as needed.       - Follow-up in 4 weeks, or sooner should s/s worsen or fail to improve.  Every 12 weeks, in-person follow-up is required per pain contract to maintain active prescriptions.     I personally spent 45 minutes face-to-face and non-face-to-face in the care of this patient, which includes all pre, intra, and post visit time on the date of service.  All documented time was specific to the E/M visit and does not include any procedures that may have been performed.    Subjective:     History of Present Illness  Kristy Garcia is a 59 year old female who presents for management of chronic nerve pain and swelling.    She has chronic tingling and aching pain starting in her back and radiating to both feet. Pain is persistent without medication. She uses duloxetine , Celebrex , and Lyrica  with partial relief and finds lidocaine  patches most effective. She is out of lidocaine  patches and requests a refill.    Swelling in her hands and feet started last month. Foot swelling worsens with walking and improves overnight when lying down. She has no associated shortness of breath.    Without medication she sleeps only three to four hours before waking. She takes trazodone  50 mg for sleep but has increased to two tablets because one no longer helps. With trazodone  she sleeps well and is not drowsy on waking.    She notes weight gain to 164 lb and thinks it is medication related. She often skips breakfast and eats small meals during the day.    She smokes half a pack of cigarettes daily. She drinks alcohol on  special occasions, about three to five drinks at a time. She last used cocaine over a year ago and last used marijuana for pain in August of last year.    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                                Relevant PMH:  OA, HTN, HLD, HIV, depression, PTSD, Grave's disease    Current treatments:     Lidocaine  patch, diclofenac  gel, celecoxib , pregabalin , duloxetine              Previous treatments:  Gabapentin   codeine      Sleep: with trazodone  it's good. Wakes up after 3-4 hours without trazodone . With trazodone  gets 8 hours and feels rested.     Mood: duloxetine  helps with mood swings    Substance Use History:  0.5ppd since 59 yo, occasional alcohol 5 drinks every 2 months, last smoked cocaine about 1 year ago, used marijuana last August    Goals: include: walking outside more      ROS:    *The review of systems is negative except what was stated elsewhere in the chart.     Objective:     Vitals:    12/11/24 0836   BP: 112/57   BP Site: L Arm   BP Position: Sitting   BP Cuff Size: Large   Pulse: 79   Resp: 16   Temp: 36.4 ??C (97.5 ??F)   TempSrc: Temporal   SpO2: 97%   Weight: 74.5 kg (164 lb 3.2 oz)   Height: 162.6 cm (5' 4.02)        Physical Exam  MEASUREMENTS: Weight- 164.             Records Review:     Results        [place]  Controlled Substance Reporting System was reviewed for this patient today.   08/01/2024 07/27/2024  6 Pregabalin  150 Mg Capsule 180.00 90 Lo Kin [id number] [place] ([id number]) 0/3 2.01 LME Other [place]   05/08/2024 12/02/2023  7 Pregabalin  150 Mg Capsule 180.00 90 Li Par [id number] [place] ([id number]) 1/1 2.01 LME Other [place]   12/09/2023 12/02/2023  8 Pregabalin  150 Mg Capsule 180.00 90 Li Par [id number] [place] ([id number]) 0/1 2.01 LME Other [place]   09/12/2023 06/20/2023  5 Gabapentin  300 Mg Capsule 180.00 90 Li Par

## 2024-12-11 DIAGNOSIS — G8929 Other chronic pain: Principal | ICD-10-CM

## 2024-12-11 DIAGNOSIS — M5442 Lumbago with sciatica, left side: Principal | ICD-10-CM

## 2024-12-11 DIAGNOSIS — Z79899 Other long term (current) drug therapy: Principal | ICD-10-CM

## 2024-12-11 DIAGNOSIS — M5441 Lumbago with sciatica, right side: Principal | ICD-10-CM

## 2024-12-11 DIAGNOSIS — F331 Major depressive disorder, recurrent, moderate: Principal | ICD-10-CM

## 2024-12-11 DIAGNOSIS — M25572 Pain in left ankle and joints of left foot: Principal | ICD-10-CM

## 2024-12-11 DIAGNOSIS — M7989 Other specified soft tissue disorders: Principal | ICD-10-CM

## 2024-12-11 DIAGNOSIS — G894 Chronic pain syndrome: Principal | ICD-10-CM

## 2024-12-11 DIAGNOSIS — M2550 Pain in unspecified joint: Principal | ICD-10-CM

## 2024-12-11 MED ORDER — NICOTINE (POLACRILEX) 2 MG GUM
BUCCAL | 3 refills | 5.00000 days | Status: CP | PRN
Start: 2024-12-11 — End: 2025-12-11
  Filled 2024-12-13: qty 110, 5d supply, fill #0

## 2024-12-11 MED ORDER — PREGABALIN 25 MG CAPSULE
ORAL_CAPSULE | Freq: Two times a day (BID) | ORAL | 2 refills | 30.00000 days | Status: CP
Start: 2024-12-11 — End: 2025-03-11

## 2024-12-11 MED ORDER — TOPIRAMATE 25 MG TABLET
ORAL_TABLET | Freq: Every evening | ORAL | 2 refills | 30.00000 days | Status: CP
Start: 2024-12-11 — End: 2025-03-11
  Filled 2024-12-13: qty 30, 30d supply, fill #0

## 2024-12-11 MED ORDER — LIDOCAINE 5 % TOPICAL PATCH
MEDICATED_PATCH | Freq: Two times a day (BID) | TRANSDERMAL | 2 refills | 8.00000 days | Status: CP
Start: 2024-12-11 — End: 2025-03-11

## 2024-12-11 MED ORDER — DULOXETINE 60 MG CAPSULE,DELAYED RELEASE
ORAL_CAPSULE | ORAL | 3 refills | 0.00000 days | Status: CP
Start: 2024-12-11 — End: 2025-12-11

## 2024-12-11 MED ORDER — PREGABALIN 150 MG CAPSULE
ORAL_CAPSULE | Freq: Two times a day (BID) | ORAL | 3 refills | 90.00000 days | Status: CP
Start: 2024-12-11 — End: 2024-12-11

## 2024-12-11 MED ORDER — DICLOFENAC 1 % TOPICAL GEL
Freq: Four times a day (QID) | TOPICAL | 5 refills | 57.00000 days | Status: CP
Start: 2024-12-11 — End: 2025-12-11

## 2024-12-11 MED ORDER — PREGABALIN 100 MG CAPSULE
ORAL_CAPSULE | Freq: Two times a day (BID) | ORAL | 2 refills | 30.00000 days | Status: CP
Start: 2024-12-11 — End: 2025-03-11
  Filled 2024-12-13: qty 60, 30d supply, fill #0

## 2024-12-11 NOTE — Progress Notes (Signed)
 Kristy Garcia     Reason for visit: Re-Establish Care Pain Clinic    Questions / Concerns that need to be addressed: no    Screening BP- 112/57 HR 79    PTHomeBP     HCDM reviewed and updated in Epic:    We are working to make sure all of our patients??? wishes are updated in Epic and part of that is documenting a Environmental Health Practitioner for each patient  A Health Care Decision Maker is someone you choose who can make health care decisions for you if you are not able - who would you most want to do this for you????  is already up to date.    HCDM (patient stated preference): Kristy Garcia,Kristy Garcia - Mother - [phone]    BPAs completed:  N/A     Annual Screenings:   Tobacco    [facility] [place] Follow-up      Kristy Garcia    Opioid pain medication agreement last signed: 01/04/2023    Most recent Naloxone  (NARCAN ) nasal spray Rx sent: N/A    Last PDMP Review: 12/10/2024  1:41 PM    Last Opioid Dispensed Provider: Not Found    Recent Visits  Date Type Provider Dept   08/07/24 Office Visit Myrna Velia Canales, MD [facility] Internal Medicine [facility]   07/06/24 Telemedicine Larrie Sarin, MD [facility] Internal Medicine [facility]   06/18/24 Office Visit Avel, Marcel Mart, MD [place] Infectious Diseases [facility]       Last Drug Screen Date: Not Found  Urine Toxicology Screen    Lab Results   Component Value Date    AMPHU Negative 01/04/2023    BARBU Negative 01/04/2023    BENZU Negative 01/04/2023    CANNAU Negative 01/04/2023    METHU Negative 01/04/2023    COCAU Negative 01/04/2023    OPIAU Negative 01/04/2023    FENTU Negative 01/04/2023    OXYCOU Negative 01/04/2023    BUPRENORPHIN Negative 01/04/2023        Last Drug Screen Date: 01/07/2023  Opiate Confirmation Test    6-Monoacetylmorphine   Date Value Ref Range Status   01/04/2023 <5 <5 ng/mL Final     Morphine   Date Value Ref Range Status   01/04/2023 <25 <25 ng/mL Final     Codeine   Date Value Ref Range Status   01/04/2023 <25 <25 ng/mL Final     Hydrocodone    Date Value Ref Range Status   01/04/2023 <25 <25 ng/mL Final     Hydromorphone   Date Value Ref Range Status   01/04/2023 <25 <25 ng/mL Final     Oxycodone    Date Value Ref Range Status   01/04/2023 <25 <25 ng/mL Final     Oxymorphone   Date Value Ref Range Status   01/04/2023 <25 <25 ng/mL Final     Buprenorphine   Date Value Ref Range Status   01/04/2023 <5 <5 ng/mL Final     Norbuprenorphine   Date Value Ref Range Status   01/04/2023 <5 <5 ng/mL Final     Fentanyl   Date Value Ref Range Status   01/04/2023 <0.5 <0.5 ng/mL Final     Norfentanyl   Date Value Ref Range Status   01/04/2023 <1.0 <1.0 ng/mL Final       Future Appointments  Date Type Provider Dept   12/19/24 Appointment Halsey, Corazon I, FNP [place] Infectious Diseases [facility]   01/29/25  Appointment Myrna Velia Canales, MD [facility] Medicine [place] [facility]   Showing future appointments within next 365 days and meeting all other requirements

## 2024-12-12 DIAGNOSIS — E0590 Thyrotoxicosis, unspecified without thyrotoxic crisis or storm: Principal | ICD-10-CM

## 2024-12-12 MED ORDER — PROPYLTHIOURACIL 50 MG TABLET
ORAL_TABLET | Freq: Two times a day (BID) | ORAL | 5 refills | 30.00000 days | Status: CP
Start: 2024-12-12 — End: 2025-06-10
  Filled 2024-12-14: qty 100, 50d supply, fill #0

## 2024-12-12 NOTE — Progress Notes (Signed)
.  Kristy Garcia has been contacted in regards to their refill of Biktarvy . At this time, they have declined refill due to patient having three weeks doses remaining. Refill assessment call date has been updated per the patient's request.

## 2024-12-13 MED FILL — VARENICLINE TARTRATE 1 MG TABLET: ORAL | 30 days supply | Qty: 60 | Fill #0

## 2024-12-13 MED FILL — VENTOLIN HFA 90 MCG/ACTUATION AEROSOL INHALER: RESPIRATORY_TRACT | 17 days supply | Qty: 18 | Fill #8

## 2024-12-13 MED FILL — SYMBICORT 80 MCG-4.5 MCG/ACTUATION HFA AEROSOL INHALER: RESPIRATORY_TRACT | 30 days supply | Qty: 10.2 | Fill #3

## 2024-12-13 MED FILL — PREGABALIN 100 MG CAPSULE: ORAL | 30 days supply | Qty: 60 | Fill #0

## 2024-12-13 MED FILL — TRAZODONE 50 MG TABLET: ORAL | 90 days supply | Qty: 90 | Fill #1

## 2024-12-13 MED FILL — QUETIAPINE 25 MG TABLET: 90 days supply | Qty: 90 | Fill #2

## 2024-12-13 MED FILL — ATORVASTATIN 20 MG TABLET: ORAL | 90 days supply | Qty: 90 | Fill #2

## 2024-12-19 ENCOUNTER — Inpatient Hospital Stay: Admit: 2024-12-19 | Discharge: 2024-12-19 | Payer: Medicaid (Managed Care)

## 2024-12-21 ENCOUNTER — Ambulatory Visit
Admit: 2024-12-21 | Discharge: 2024-12-22 | Payer: Medicaid (Managed Care) | Attending: "Endocrinology | Primary: "Endocrinology

## 2024-12-21 ENCOUNTER — Encounter: Admit: 2024-12-21 | Discharge: 2024-12-22 | Payer: Medicaid (Managed Care) | Attending: Family | Primary: Family

## 2024-12-21 DIAGNOSIS — E0500 Thyrotoxicosis with diffuse goiter without thyrotoxic crisis or storm: Secondary | ICD-10-CM

## 2024-12-21 DIAGNOSIS — R7303 Prediabetes: Principal | ICD-10-CM

## 2024-12-21 DIAGNOSIS — M7989 Other specified soft tissue disorders: Secondary | ICD-10-CM

## 2024-12-21 DIAGNOSIS — Z113 Encounter for screening for infections with a predominantly sexual mode of transmission: Secondary | ICD-10-CM

## 2024-12-21 DIAGNOSIS — K629 Disease of anus and rectum, unspecified: Secondary | ICD-10-CM

## 2024-12-21 DIAGNOSIS — Z5181 Encounter for therapeutic drug level monitoring: Secondary | ICD-10-CM

## 2024-12-21 DIAGNOSIS — Z79899 Other long term (current) drug therapy: Principal | ICD-10-CM

## 2024-12-21 DIAGNOSIS — B20 Human immunodeficiency virus [HIV] disease: Secondary | ICD-10-CM

## 2024-12-21 DIAGNOSIS — J302 Other seasonal allergic rhinitis: Secondary | ICD-10-CM

## 2024-12-21 DIAGNOSIS — Z0184 Encounter for antibody response examination: Secondary | ICD-10-CM

## 2024-12-21 LAB — CBC W/ AUTO DIFF
BASOPHILS ABSOLUTE COUNT: 0 10*9/L (ref 0.0–0.1)
BASOPHILS RELATIVE PERCENT: 0.4 %
EOSINOPHILS ABSOLUTE COUNT: 0 10*9/L (ref 0.0–0.5)
EOSINOPHILS RELATIVE PERCENT: 0.4 %
HEMATOCRIT: 39.7 % (ref 34.0–44.0)
HEMOGLOBIN: 13.4 g/dL (ref 11.3–14.9)
LYMPHOCYTES ABSOLUTE COUNT: 2.4 10*9/L (ref 1.1–3.6)
LYMPHOCYTES RELATIVE PERCENT: 38.1 %
MEAN CORPUSCULAR HEMOGLOBIN CONC: 33.7 g/dL (ref 32.0–36.0)
MEAN CORPUSCULAR HEMOGLOBIN: 31.5 pg (ref 25.9–32.4)
MEAN CORPUSCULAR VOLUME: 93.6 fL (ref 77.6–95.7)
MEAN PLATELET VOLUME: 9.8 fL (ref 6.8–10.7)
MONOCYTES ABSOLUTE COUNT: 0.3 10*9/L (ref 0.3–0.8)
MONOCYTES RELATIVE PERCENT: 4.3 %
NEUTROPHILS ABSOLUTE COUNT: 3.6 10*9/L (ref 1.8–7.8)
NEUTROPHILS RELATIVE PERCENT: 56.8 %
PLATELET COUNT: 217 10*9/L (ref 150–450)
RED BLOOD CELL COUNT: 4.24 10*12/L (ref 3.95–5.13)
RED CELL DISTRIBUTION WIDTH: 14.2 % (ref 12.2–15.2)
WBC ADJUSTED: 6.4 10*9/L (ref 3.6–11.2)

## 2024-12-21 LAB — LYMPH MARKER LIMITED,FLOW
ABSOLUTE CD3 CNT: 1992 {cells}/uL (ref 915–3400)
ABSOLUTE CD4 CNT: 1248 {cells}/uL (ref 510–2320)
ABSOLUTE CD8 CNT: 744 {cells}/uL (ref 180–1520)
CD3% (T CELLS): 83 % (ref 61–86)
CD4% (T HELPER): 52 % (ref 34–58)
CD4:CD8 RATIO: 1.7 (ref 0.9–4.8)
CD8% T SUPPRESR: 31 % (ref 12–38)

## 2024-12-21 LAB — BASIC METABOLIC PANEL
ANION GAP: 9 mmol/L (ref 5–14)
BLOOD UREA NITROGEN: 9 mg/dL (ref 9–23)
BUN / CREAT RATIO: 10
CALCIUM: 9.7 mg/dL (ref 8.7–10.4)
CHLORIDE: 103 mmol/L (ref 98–107)
CO2: 28.2 mmol/L (ref 20.0–31.0)
CREATININE: 0.86 mg/dL (ref 0.55–1.02)
EGFR CKD-EPI (2021) FEMALE: 78 mL/min/{1.73_m2} (ref >=60)
GLUCOSE RANDOM: 128 mg/dL (ref 70–179)
POTASSIUM: 4 mmol/L (ref 3.4–4.8)
SODIUM: 140 mmol/L (ref 135–145)

## 2024-12-21 LAB — HEPATIC FUNCTION PANEL
ALBUMIN: 4.2 g/dL (ref 3.4–5.0)
ALKALINE PHOSPHATASE: 87 U/L (ref 46–116)
ALT (SGPT): 16 U/L (ref 10–49)
AST (SGOT): 17 U/L (ref <=34)
BILIRUBIN DIRECT: 0.1 mg/dL (ref 0.00–0.30)
BILIRUBIN TOTAL: 0.5 mg/dL (ref 0.3–1.2)
PROTEIN TOTAL: 7.6 g/dL (ref 5.7–8.2)

## 2024-12-21 LAB — HIV RNA, QUANTITATIVE, PCR: HIV RNA QNT RSLT: NOT DETECTED

## 2024-12-21 LAB — TSH: THYROID STIMULATING HORMONE: 1.117 u[IU]/mL (ref 0.550–4.780)

## 2024-12-21 LAB — T4, FREE: FREE T4: 1.07 ng/dL (ref 0.89–1.76)

## 2024-12-21 LAB — HEMOGLOBIN A1C
ESTIMATED AVERAGE GLUCOSE: 146 mg/dL
HEMOGLOBIN A1C: 6.7 % — ABNORMAL HIGH (ref 4.8–5.6)

## 2024-12-21 LAB — T3, FREE: T3 FREE: 3.03 pg/mL (ref 2.30–4.20)

## 2024-12-21 MED ORDER — BIKTARVY 50 MG-200 MG-25 MG TABLET
ORAL_TABLET | Freq: Every day | ORAL | 11 refills | 30.00000 days | Status: CP
Start: 2024-12-21 — End: 2025-12-21

## 2024-12-21 MED ORDER — FLUTICASONE PROPIONATE 50 MCG/ACTUATION NASAL SPRAY,SUSPENSION
Freq: Every day | NASAL | 1 refills | 120.00000 days | Status: CP
Start: 2024-12-21 — End: ?

## 2024-12-21 NOTE — Patient Instructions (Addendum)
 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    Agree with smoking cessation, this can reduce risk of thyroid eye disease.    Will check labs today and adjust the PTU if needed.    Nancyann Gate MD  Assistant Professor of Medicine  [facility] Division of Endocrinology and Metabolism  Phone: [phone]    Fax: [phone]
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
## 2024-12-21 NOTE — Patient Instructions (Signed)
 COVID Education:  Make sure you perform good hand washing (lasting 20 seconds), continue to social distance and limit close personal contact (which may include new sexual partners or having multiple partners during this period).  Try to isolate at home but please find ways to keep in touch with those close to you, such as meeting up with them electronically or socially distanced, and the ability to go outdoors alone or separated from others  If you become ill with fever, respiratory illness, sudden loss of taste and smell, stomach issues, diarrhea, nausea, vomiting - contact clinic for further instructions.  You should go to the emergency department if you develop systems such as shortness of breath, confusion, lightheadedness when standing, high fever.   Here is some information about HIV and CoVid vaccines: [email or web address]  If you're interested in receiving the CoVid vaccine when you're eligible, here are some resources for you to check and make an appointment:  Your local health department   [email or web address] through [facility]  [email or web address]  [facility] (if you are an established patient with them)  [email or web address]    URGENT CARE  Please call ahead to speak with the nursing staff if you are in need of an urgent appointment.       MEDICATIONS  For refills please contact your pharmacy and ask them to electronically send or fax the request to the clinic.   Please bring all medications in original bottles to every appointment.    [facility] (formerly [facility]) or [employer] Eligibility (required even if you do not receive medication through [facility])  Please remember to renew your [employer] eligibility during renewal periods which occur twice a year: January-March and July-September.     The following are needed for each renewal:   - [place]  Identification (if you don't have one, then a bill with your name and address in [place] )   - proof of income (award letter, W-2, or last three check stubs)   If you are unable to come in for renewal, let us  know if we can mail, fax or e-mail paperwork to you.   [facility] Contact: [phone].     Lab info:  Your most recent CD4 T-cell counts and viral loads are below. Here are a few things to keep in mind when looking at your numbers:  Our goal is to get your virus to be undetectable and keep it undetectable. If the virus is undetectable you are much more likely to stay healthy.  We consider your viral load to be undetectable if it says <40 or if it says Not detected.  For most people, we're checking CD4 counts every other visit (once or twice a year, or sometimes even less).  It's normal for your CD4 count to be different from visit to visit.   You can help by taking your medications at about the same time, every single day. If you're having trouble with taking your medications, it's important to let us  know.    Lab Results   Component Value Date    ACD4 1,250 06/18/2024    CD4 50 06/18/2024    HIVCP <20 07/22/2023    HIVRS Not Detected 06/18/2024        Please note that your laboratory and other results may be visible to you in real time, possibly before they reach your provider. Please allow 48 hours for clinical interpretation of these results. Importantly, even if a result is flagged as abnormal, it may not be one that impacts your  health.    It was nice to have a visit with you today!  Follow-up information:        Provider today:  Rilynn Habel, FNP-BC      ID CLINIC address:   [facility] at [facility]  [address]  [place], [place] [address]    Contact information:    The ID clinic phone number is [phone]   The ID clinic fax number is [phone]  For urgent issues on nights and weekends: Call the ID Physician on-call through the [facility] Operator at [phone].    Please sign up for My [facility] Chart - This is a great way to review your labs and track your appointments    Please try to arrive 30 minutes BEFORE your scheduled appointment time!  This will give you time to fill out any front desk paperwork needed for your visit, and allow you to be seen as close to your scheduled appointment time as possible.

## 2024-12-24 LAB — RUBELLA ANTIBODY, IGG: RUBELLA IGG SCREEN: POSITIVE

## 2024-12-24 LAB — MUMPS ANTIBODY, IGG: MUMPS IGG ANTIBODY: POSITIVE

## 2024-12-24 LAB — SYPHILIS SCREEN: SYPHILIS RPR SCREEN: NONREACTIVE

## 2024-12-24 LAB — MEASLES (RUBEOLA) ANTIBODY IGG: MEASLES IGG ANTIBODY: NEGATIVE — AB

## 2024-12-25 DIAGNOSIS — T3695XA Adverse effect of unspecified systemic antibiotic, initial encounter: Secondary | ICD-10-CM

## 2024-12-25 DIAGNOSIS — B379 Candidiasis, unspecified: Secondary | ICD-10-CM

## 2024-12-25 DIAGNOSIS — K629 Disease of anus and rectum, unspecified: Principal | ICD-10-CM

## 2024-12-25 MED ORDER — AMOXICILLIN 875 MG-POTASSIUM CLAVULANATE 125 MG TABLET
ORAL_TABLET | Freq: Two times a day (BID) | ORAL | 0 refills | 5.00000 days | Status: CP
Start: 2024-12-25 — End: 2024-12-30

## 2024-12-25 MED ORDER — MICONAZOLE NITRATE 2 % VAGINAL CREAM
TOPICAL | 0 refills | 0.00000 days | Status: CP
Start: 2024-12-25 — End: 2024-12-25

## 2024-12-27 NOTE — Progress Notes (Signed)
 [facility] Refill Coordination Note    Specialty Medication(s) to be Shipped:   Infectious Disease: BIKTARVY  50-200-25 mg tablet (bictegrav-emtricit-tenofov ala)    Other medication(s) to be shipped: amoxicillin -clavulanate 875-125 mg per tablet (AUGMENTIN )    Specialty Medications not needed at this time: N/A     Kristy Garcia, DOB: July 23, 1966  Phone: [phone] (home)       All above HIPAA information was verified with patient.     Was a nurse, learning disability used for this call? No    Completed refill call assessment today to schedule patient's medication shipment from the [facility]  [phone]).  All relevant notes have been reviewed.     Specialty medication(s) and dose(s) confirmed: Regimen is correct and unchanged.   Changes to medications: [name] reports no changes at this time.  Changes to insurance: No  New side effects reported not previously addressed with a pharmacist or physician: None reported  Questions for the pharmacist: No    Confirmed patient received a Conservation Officer, Historic Buildings and a Surveyor, Mining with first shipment. The patient will receive a drug information handout for each medication shipped and additional FDA Medication Guides as required.       DISEASE/MEDICATION-SPECIFIC INFORMATION        N/A    SPECIALTY MEDICATION ADHERENCE     Medication Adherence    Patient reported X missed doses in the last month: 0  Specialty Medication: BIKTARVY  50-200-25 mg tablet (bictegrav-emtricit-tenofov ala)  Patient is on additional specialty medications: No  Informant: patient              Were doses missed due to medication being on hold? No    BIKTARVY  50-200-25 mg tablet (bictegrav-emtricit-tenofov ala): 6 days of medicine on hand       Specialty medication is an injection or given on a cycle: No    REFERRAL TO PHARMACIST     Referral to the pharmacist: Not needed      [facility]     Shipping address confirmed in Epic.     Cost and Payment: Patient has a copay of $9.76. The patient has Medicaid and has expressed that they are unable to provide payment at this time. The patient will be sent a bill for them to pay a later date.    Delivery Scheduled: Yes, Expected medication delivery date: 2/9.     Medication will be delivered via Next Day Courier to the prescription address in Epic WAM.    Kristy Garcia [name]

## 2024-12-28 LAB — THYROID STIMULATING IMMUNOGLOBULIN: THYROID STIMULATING IMMUNOGLOB: <1 {TSI_index}
